# Patient Record
Sex: Male | Born: 1965 | Race: White | Hispanic: No | Marital: Married | Smoking: Never smoker
Health system: Southern US, Community
[De-identification: ages and names within clinical notes are randomized; demographics above are authoritative.]

## PROBLEM LIST (undated history)

## (undated) DIAGNOSIS — I10 Essential (primary) hypertension: Secondary | ICD-10-CM

## (undated) DIAGNOSIS — E785 Hyperlipidemia, unspecified: Secondary | ICD-10-CM

## (undated) DIAGNOSIS — K219 Gastro-esophageal reflux disease without esophagitis: Secondary | ICD-10-CM

## (undated) HISTORY — DX: Hyperlipidemia, unspecified: E78.5

## (undated) HISTORY — DX: Gastro-esophageal reflux disease without esophagitis: K21.9

---

## 1997-11-10 HISTORY — PX: ORCHIECTOMY: SHX2116

## 2004-12-13 ENCOUNTER — Ambulatory Visit: Payer: Self-pay | Admitting: Internal Medicine

## 2005-08-20 ENCOUNTER — Ambulatory Visit: Payer: Self-pay | Admitting: Internal Medicine

## 2005-08-25 ENCOUNTER — Ambulatory Visit: Payer: Self-pay | Admitting: Internal Medicine

## 2006-11-10 HISTORY — PX: UPPER GASTROINTESTINAL ENDOSCOPY: SHX188

## 2006-11-10 HISTORY — PX: VASECTOMY: SHX75

## 2007-11-16 ENCOUNTER — Ambulatory Visit: Payer: Self-pay | Admitting: Internal Medicine

## 2007-11-16 DIAGNOSIS — E785 Hyperlipidemia, unspecified: Secondary | ICD-10-CM

## 2007-11-29 ENCOUNTER — Encounter (INDEPENDENT_AMBULATORY_CARE_PROVIDER_SITE_OTHER): Payer: Self-pay | Admitting: *Deleted

## 2007-12-29 ENCOUNTER — Ambulatory Visit: Payer: Self-pay | Admitting: Gastroenterology

## 2007-12-30 ENCOUNTER — Telehealth (INDEPENDENT_AMBULATORY_CARE_PROVIDER_SITE_OTHER): Payer: Self-pay | Admitting: *Deleted

## 2008-01-04 ENCOUNTER — Ambulatory Visit (HOSPITAL_COMMUNITY): Admission: RE | Admit: 2008-01-04 | Discharge: 2008-01-04 | Payer: Self-pay | Admitting: Gastroenterology

## 2008-01-04 ENCOUNTER — Encounter: Payer: Self-pay | Admitting: Gastroenterology

## 2008-01-04 ENCOUNTER — Encounter: Payer: Self-pay | Admitting: Internal Medicine

## 2008-01-07 ENCOUNTER — Ambulatory Visit: Payer: Self-pay | Admitting: Gastroenterology

## 2008-01-20 DIAGNOSIS — R1310 Dysphagia, unspecified: Secondary | ICD-10-CM | POA: Insufficient documentation

## 2008-03-14 ENCOUNTER — Ambulatory Visit: Payer: Self-pay | Admitting: Internal Medicine

## 2008-03-22 LAB — CONVERTED CEMR LAB
Alkaline Phosphatase: 63 units/L (ref 39–117)
Bilirubin, Direct: 0.1 mg/dL (ref 0.0–0.3)
Total CHOL/HDL Ratio: 3.5
VLDL: 13 mg/dL (ref 0–40)

## 2008-03-23 ENCOUNTER — Encounter (INDEPENDENT_AMBULATORY_CARE_PROVIDER_SITE_OTHER): Payer: Self-pay | Admitting: *Deleted

## 2008-10-17 ENCOUNTER — Telehealth (INDEPENDENT_AMBULATORY_CARE_PROVIDER_SITE_OTHER): Payer: Self-pay | Admitting: *Deleted

## 2009-05-08 ENCOUNTER — Ambulatory Visit: Payer: Self-pay | Admitting: Internal Medicine

## 2009-05-15 ENCOUNTER — Encounter (INDEPENDENT_AMBULATORY_CARE_PROVIDER_SITE_OTHER): Payer: Self-pay | Admitting: *Deleted

## 2009-05-15 LAB — CONVERTED CEMR LAB
HDL: 53 mg/dL (ref >39.00)
LDL Cholesterol: 123 mg/dL — ABNORMAL HIGH (ref 0–99)
Total CHOL/HDL Ratio: 4
Triglycerides: 81 mg/dL (ref 0.0–149.0)

## 2009-05-17 ENCOUNTER — Ambulatory Visit: Payer: Self-pay | Admitting: Internal Medicine

## 2009-05-17 DIAGNOSIS — K219 Gastro-esophageal reflux disease without esophagitis: Secondary | ICD-10-CM | POA: Insufficient documentation

## 2009-05-17 DIAGNOSIS — K222 Esophageal obstruction: Secondary | ICD-10-CM

## 2010-02-22 ENCOUNTER — Telehealth (INDEPENDENT_AMBULATORY_CARE_PROVIDER_SITE_OTHER): Payer: Self-pay | Admitting: *Deleted

## 2010-05-27 ENCOUNTER — Ambulatory Visit: Payer: Self-pay | Admitting: Internal Medicine

## 2010-05-30 ENCOUNTER — Ambulatory Visit: Payer: Self-pay | Admitting: Internal Medicine

## 2010-06-06 LAB — CONVERTED CEMR LAB
ALT: 23 units/L (ref 0–53)
AST: 22 units/L (ref 0–37)
Basophils Relative: 0.4 % (ref 0.0–3.0)
Bilirubin, Direct: 0.1 mg/dL (ref 0.0–0.3)
Chloride: 108 meq/L (ref 96–112)
Creatinine, Ser: 0.7 mg/dL (ref 0.4–1.5)
Eosinophils Relative: 4.9 % (ref 0.0–5.0)
HCT: 44.1 % (ref 39.0–52.0)
LDL Cholesterol: 125 mg/dL — ABNORMAL HIGH (ref 0–99)
MCV: 91.5 fL (ref 78.0–100.0)
Monocytes Absolute: 0.6 10*3/uL (ref 0.1–1.0)
Monocytes Relative: 9.1 % (ref 3.0–12.0)
Neutrophils Relative %: 57.8 % (ref 43.0–77.0)
Potassium: 4.6 meq/L (ref 3.5–5.1)
RBC: 4.82 M/uL (ref 4.22–5.81)
Total CHOL/HDL Ratio: 4
Total Protein: 7.5 g/dL (ref 6.0–8.3)
WBC: 6.7 10*3/uL (ref 4.5–10.5)

## 2010-07-08 ENCOUNTER — Telehealth (INDEPENDENT_AMBULATORY_CARE_PROVIDER_SITE_OTHER): Payer: Self-pay | Admitting: *Deleted

## 2010-10-01 ENCOUNTER — Telehealth (INDEPENDENT_AMBULATORY_CARE_PROVIDER_SITE_OTHER): Payer: Self-pay | Admitting: *Deleted

## 2010-10-30 ENCOUNTER — Telehealth (INDEPENDENT_AMBULATORY_CARE_PROVIDER_SITE_OTHER): Payer: Self-pay | Admitting: *Deleted

## 2010-12-08 LAB — CONVERTED CEMR LAB
AST: 21 units/L (ref 0–37)
Bilirubin, Direct: 0.1 mg/dL (ref 0.0–0.3)
Chloride: 101 meq/L (ref 96–112)
Eosinophils Absolute: 0.2 10*3/uL (ref 0.0–0.6)
Eosinophils Relative: 2.2 % (ref 0.0–5.0)
GFR calc non Af Amer: 98 mL/min
Glucose, Bld: 86 mg/dL (ref 70–99)
HCT: 44.6 % (ref 39.0–52.0)
Hemoglobin: 15.4 g/dL (ref 13.0–17.0)
Lymphocytes Relative: 28.1 % (ref 12.0–46.0)
MCV: 90.2 fL (ref 78.0–100.0)
Monocytes Absolute: 0.4 10*3/uL (ref 0.2–0.7)
Neutrophils Relative %: 63.1 % (ref 43.0–77.0)
RBC: 4.95 M/uL (ref 4.22–5.81)
Sodium: 137 meq/L (ref 135–145)
TSH: 1.8 microintl units/mL (ref 0.35–5.50)
Total Bilirubin: 1 mg/dL (ref 0.3–1.2)
WBC: 6.9 10*3/uL (ref 4.5–10.5)

## 2010-12-10 NOTE — Progress Notes (Signed)
Summary: left msg for pt to call  Phone Note Call from Patient   Caller: Patient Summary of Call: pt called left msg wants referral for dermatology --Called pt left msg to call back more clarification .Kandice Hams  February 22, 2010 2:32 PM Called pt left msg schedule  re request foer dermatology, will need an office visit, please call and schedule.  last ov 7/10 .Kandice Hams  February 27, 2010 1:29 PM   Initial call taken by: Kandice Hams,  February 27, 2010 1:29 PM

## 2010-12-10 NOTE — Progress Notes (Signed)
Summary: Refill Request  Phone Note Refill Request Call back at [phone] Message from:  Pharmacy on October 01, 2010 2:21 PM  Refills Requested: Medication #1:  ZOLPIDEM TARTRATE 5 MG TABS Zolpidem 5 mg.   Dosage confirmed as above?Dosage Confirmed   Supply Requested: 1 month   Last Refilled: 07/08/2010 [facility] [address]   Next Appointment Scheduled: none Initial call taken by: Harold Barban,  October 01, 2010 2:22 PM    Prescriptions: ZOLPIDEM TARTRATE 5 MG TABS (ZOLPIDEM TARTRATE) Zolpidem 5 mg  #10 x 0   Entered by:   Shonna Chock CMA   Authorized by:   Marga Melnick MD   Signed by:   Shonna Chock CMA on 10/01/2010   Method used:   Printed then faxed to ...       [facility] (retail)       [address]       [place], [place]  [address]       Ph: [phone]       Fax: [phone]   RxID:   [id number]

## 2010-12-10 NOTE — Assessment & Plan Note (Signed)
Summary: med refill/kdc   Vital Signs:  Patient profile:   45 year old male Height:      73.75 inches Weight:      182.6 pounds BMI:     23.69 Temp:     98.2 degrees F oral Pulse rate:   72 / minute Resp:     14 per minute BP sitting:   118 / 76  (left arm) Cuff size:   large  Vitals Entered By: Shonna Chock CMA (May 27, 2010 2:21 PM)    History of Present Illness: [name] [name] is here for a physical; he is asymptomatic.His only Rx med is Pravastatin.  The patient denies muscle aches, GI upset, abdominal pain, flushing, itching, constipation, diarrhea, and fatigue.  The patient denies the following symptoms: chest pain/pressure, exercise intolerance, dypsnea, palpitations, syncope, and pedal edema.  Compliance with medications (by patient report) has been near 100%.  Dietary compliance has been excellent.  Adjunctive measures currently used by the patient include ASA, fish oil supplements, and Co-QA.    Lipid Management History:      Negative NCEP/ATP III risk factors include male age less than 4 years old, non-diabetic, no family history for ischemic heart disease, non-tobacco-user status, non-hypertensive, no ASHD (atherosclerotic heart disease), no prior stroke/TIA, no peripheral vascular disease, and no history of aortic aneurysm.     Current Medications (verified): 1)  Pravastatin Sodium 40 Mg Tabs (Pravastatin Sodium) .Marland Kitchen.. 1 At Bedtime 2)  Coq10 100 Mg Caps (Coenzyme Q10) .Marland Kitchen.. 1 By Mouth Once Daily 3)  Aspirin 81 Mg Tabs (Aspirin) .Marland Kitchen.. 1 By Mouth Once Daily 4)  Multivitamins  Tabs (Multiple Vitamin) .Marland Kitchen.. 1 By Mouth Once Daily 5)  Fish Oil 2000mg  .... 1 By Mouth Once Daily 6)  Zinc 100 Mg Tabs (Zinc) .Marland Kitchen.. 1 By Mouth Once Daily 7)  Melatonin 1 Mg Caps (Melatonin) .Marland Kitchen.. 1 By Mouth Once Daily  Allergies (verified): No Known Drug Allergies  Past History:  Past Medical History: Seborrheic Dermatitis Hyperlipidemia: 2009NMR Lipoprofile: LDL 194( 1891/908), HDL 49,TG 103. LDL goal =  < 130.[place] Study LDL goal = < 160. GERD with esophageal stricture  Past Surgical History: 1998 nodule  right testicle  , benign  Endoscopy : esophageal stricture, S/P dilatation 2009, Dr Barnet Pall  Family History: mother: elevated cholesterol father :stent @ 27 sister :good health brother: good health; paternal  grandparents : CVA & ? MI; MGF :CHF; MGM :CHF  Social History: heart healthy Occupation: Pensions consultant Married Never Smoked Alcohol use-yes: socially Regular exercise-yes:CVE 60 min 3X/week  Review of Systems  The patient denies anorexia, fever, weight loss, weight gain, vision loss, decreased hearing, hoarseness, prolonged cough, headaches, abdominal pain, melena, hematochezia, severe indigestion/heartburn, hematuria, suspicious skin lesions, depression, unusual weight change, abnormal bleeding, enlarged lymph nodes, and angioedema.    Physical Exam  General:  well-nourished; alert,appropriate and cooperative throughout examination Head:  Normocephalic and atraumatic without obvious abnormalities. Pattern  alopecia  Eyes:  No corneal or conjunctival inflammation noted. Perrla. Funduscopic exam benign, without hemorrhages, exudates or papilledema. Ears:  External ear exam shows no significant lesions or deformities.  Otoscopic examination reveals clear canals, tympanic membranes are intact bilaterally without bulging, retraction, inflammation or discharge. Hearing is grossly normal bilaterally. Nose:  External nasal examination shows no deformity or inflammation. Nasal mucosa are pink and moist without lesions or exudates. Mouth:  Oral mucosa and oropharynx without lesions or exudates.  Teeth in good repair. Neck:  No deformities, masses, or tenderness noted. Lungs:  Normal  respiratory effort, chest expands symmetrically. Lungs are clear to auscultation, no crackles or wheezes. Heart:  Normal rate and regular rhythm. S1 and S2 normal without gallop, murmur, click, rub .S4  with slurring Abdomen:  Bowel sounds positive,abdomen soft and non-tender without masses, organomegaly or hernias noted. Rectal:  No external abnormalities noted. Normal sphincter tone. No rectal masses or tenderness. Genitalia:   Single testicle  without nodularity, tenderness or masses. No scrotal masses or lesions. No penis lesions or urethral discharge. Prostate:  Prostate gland firm and smooth, no enlargement, nodularity, tenderness, mass, asymmetry or induration. Msk:  No deformity or scoliosis noted of thoracic or lumbar spine.   Pulses:  R and L carotid,radial,dorsalis pedis and posterior tibial pulses are full and equal bilaterally Extremities:  No clubbing, cyanosis, edema, or deformity noted with normal full range of motion of all joints.   Neurologic:  alert & oriented X3, gait normal, and DTRs symmetrical and normal.   Skin:  Intact without suspicious lesions or rashes Cervical Nodes:  No lymphadenopathy noted Axillary Nodes:  No palpable lymphadenopathy Inguinal Nodes:  No significant adenopathy Psych:  memory intact for recent and remote, normally interactive, and good eye contact.     Impression & Recommendations:  Problem # 1:  ROUTINE GENERAL MEDICAL EXAM@HEALTH  CARE FACL (ICD-V70.0)  Orders: EKG w/ Interpretation (93000)  Problem # 2:  HYPERLIPIDEMIA (ICD-272.2)  The following medications were removed from the medication list:    Pravachol 40 Mg Tabs (Pravastatin sodium) .Marland Kitchen... 1 at bedtime**labs due now** His updated medication list for this problem includes:    Pravastatin Sodium 40 Mg Tabs (Pravastatin sodium) .Marland Kitchen... 1 at bedtime  Problem # 3:  GERD (ICD-530.81) controlled The following medications were removed from the medication list:    Pantoprazole Sodium 40 Mg Tbec (Pantoprazole sodium) .Marland Kitchen... 1 by mouth once daily**office visit due now**    Omeprazole 20 Mg Cpdr (Omeprazole) .Marland Kitchen... 1 q am  Complete Medication List: 1)  Pravastatin Sodium 40 Mg Tabs  (Pravastatin sodium) .Marland Kitchen.. 1 at bedtime 2)  Coq10 100 Mg Caps (Coenzyme q10) .Marland Kitchen.. 1 by mouth once daily 3)  Aspirin 81 Mg Tabs (Aspirin) .Marland Kitchen.. 1 by mouth once daily 4)  Multivitamins Tabs (Multiple vitamin) .Marland Kitchen.. 1 by mouth once daily 5)  Fish Oil 2000mg   .... 1 by mouth once daily 6)  Zinc 100 Mg Tabs (Zinc) .Marland Kitchen.. 1 by mouth once daily 7)  Melatonin 1 Mg Caps (Melatonin) .Marland Kitchen.. 1 by mouth once daily  Lipid Assessment/Plan:      Based on NCEP/ATP III, the patient's risk factor category is "0-1 risk factors".  The patient's lipid goals are as follows: Total cholesterol goal is 200; LDL cholesterol goal is 130; HDL cholesterol goal is 40; Triglyceride goal is 150.  His LDL cholesterol goal has been met.    Patient Instructions: 1)  Please schedule fasting labs. 2)  Avoid foods high in acid (tomatoes, citrus juices, spicy foods). Avoid eating within two hours of lying down or before exercising. Do not over eat; try smaller more frequent meals. Elevate head of bed twelve inches when sleeping. 3)  BMP ; 4)  Hepatic Panel ; 5)  Lipid Panel; 6)  TSH ; 7)  CBC w/ Diff . Prescriptions: PRAVASTATIN SODIUM 40 MG TABS (PRAVASTATIN SODIUM) 1 at bedtime  #90 x 3   Entered and Authorized by:   Marga Melnick MD   Signed by:   Marga Melnick MD on 05/27/2010   Method used:   Print  then Give to Patient   RxID:   [id number]

## 2010-12-10 NOTE — Progress Notes (Signed)
Summary: RX FOR AMBIEN  Phone Note Call from Patient   Caller: Patient Reason for Call: Privacy/Consent Authorization Summary of Call: PT WOULD LIKE AN RX FOR AMBIEN. RX IS NOT ON MED LIST. PLEASE ADVISE. [facility] [address] [phone]. Initial call taken by: Lavell Islam,  July 08, 2010 9:20 AM  Follow-up for Phone Call        I left message on VM informing patient Dr.Hopper is out of the office(will return tomorrow) and he will have to advise on adding controlled med back to patient's med list Follow-up by: Shonna Chock CMA,  July 08, 2010 12:30 PM  Additional Follow-up for Phone Call Additional follow up Details #1::        Zolpidem 5 mg at bedtime every 3rd night as needed  #10. If insomnia is chronic ; I recommend consulatation with Dr Glean Salen , Sleep Specialist to determine cause& most effective intervention.    New/Updated Medications: ZOLPIDEM TARTRATE 5 MG TABS (ZOLPIDEM TARTRATE) Zolpidem 5 mg Prescriptions: ZOLPIDEM TARTRATE 5 MG TABS (ZOLPIDEM TARTRATE) Zolpidem 5 mg  #10 x 0   Entered by:   Shonna Chock CMA   Authorized by:   Loreen Freud DO   Signed by:   Shonna Chock CMA on 07/08/2010   Method used:   Printed then faxed to ...       [facility] (retail)       [address]       [place], [place]  [address]       Ph: [phone]       Fax: [phone]   RxID:   [id number]

## 2010-12-12 NOTE — Progress Notes (Signed)
Summary: Refill Request  Phone Note Refill Request Call back at [phone] Message from:  Pharmacy on October 30, 2010 8:13 AM  Refills Requested: Medication #1:  ZOLPIDEM TARTRATE 5 MG TABS Zolpidem 5 mg.   Dosage confirmed as above?Dosage Confirmed   Supply Requested: 10   Last Refilled: [facility]/01/2010 [facility] [address]   Next Appointment Scheduled: none Initial call taken by: Harold Barban,  October 30, 2010 8:14 AM    Prescriptions: ZOLPIDEM TARTRATE 5 MG TABS (ZOLPIDEM TARTRATE) Zolpidem 5 mg  #10 x 0   Entered by:   Shonna Chock CMA   Authorized by:   Marga Melnick MD   Signed by:   Shonna Chock CMA on 10/30/2010   Method used:   Telephoned to ...       [facility] (retail)       [address]       [place], [place]  [address]       Ph: [phone]       Fax: [phone]   RxID:   [id number]

## 2011-01-01 ENCOUNTER — Telehealth (INDEPENDENT_AMBULATORY_CARE_PROVIDER_SITE_OTHER): Payer: Self-pay | Admitting: *Deleted

## 2011-01-07 NOTE — Progress Notes (Signed)
Summary: refill  Phone Note Refill Request Message from:  Fax from Pharmacy on January 01, 2011 9:19 AM  Refills Requested: Medication #1:  ZOLPIDEM TARTRATE 5 MG TABS Zolpidem [facility] mg. [facility] [address] - fax [phone]  Initial call taken by: Okey Regal Spring,  January 01, 2011 9:20 AM    Prescriptions: ZOLPIDEM TARTRATE 5 MG TABS (ZOLPIDEM TARTRATE) Zolpidem 5 mg  #10 x 1   Entered by:   Shonna Chock CMA   Authorized by:   Marga Melnick MD   Signed by:   Shonna Chock CMA on 01/01/2011   Method used:   Printed then faxed to ...       [facility] (retail)       [address]       [place], [place]  [address]       Ph: [phone]       Fax: [phone]   RxID:   [id number]

## 2011-01-27 ENCOUNTER — Telehealth: Payer: Self-pay

## 2011-01-30 ENCOUNTER — Other Ambulatory Visit: Payer: Self-pay | Admitting: Internal Medicine

## 2011-01-30 MED ORDER — ZOLPIDEM TARTRATE 5 MG PO TABS: 5.0000 mg | ORAL_TABLET | Freq: Every evening | ORAL | Status: DC | PRN

## 2011-01-30 NOTE — Telephone Encounter (Signed)
This was filled on 01/01/11 #10 with 1 refill. I spoke with patient  On 01/27/2011 and he indicated that he got the #10 but not the refill. I then called the pharmacy and had them ok the 1 refill pending.   I called pharmacy today to see if patient was requesting med again today and spoke with Tresa Endo and she indicated no this was on automatic refill and we can void

## 2011-02-06 NOTE — Progress Notes (Signed)
Summary: Refill   Phone Note Refill Request Message from:  Fax from Pharmacy on January 27, 2011 10:05 AM  Refills Requested: Medication #1:  ZOLPIDEM TARTRATE 5 MG TABS Zolpidem [facility] mg. [facility] - fax [phone]  Initial call taken by: Okey Regal Spring,  January 27, 2011 10:05 AM  Follow-up for Phone Call        Left message on machine for patient to return call when avaliable, Reason for call:   Discuss refill request, on 01/01/2011 # 10 with 1 refill given. RX to be taken 1 by mouth every 3rd day as needed. ? if patient already took #10 and refill that was given. Follow-up by: Shonna Chock CMA,  January 27, 2011 10:28 AM  Additional Follow-up for Phone Call Additional follow up Details #1::        Spoke with patient, patient states he picked up # 10 but not the refill yet, I informed patient I will make sure refill on file at pharmacy. Additional Follow-up by: Shonna Chock CMA,  January 27, 2011 11:17 AM

## 2011-02-12 ENCOUNTER — Other Ambulatory Visit: Payer: Self-pay

## 2011-02-12 MED ORDER — ZOLPIDEM TARTRATE 5 MG PO TABS: 5.0000 mg | ORAL_TABLET | Freq: Every evening | ORAL | Status: DC | PRN

## 2011-03-25 NOTE — Assessment & Plan Note (Signed)
[facility]                         GASTROENTEROLOGY OFFICE NOTE   [name] [name], [name] [name]                        MRN:          [id number]  DATE:12/29/2007                            DOB:          Dec 22, 1965    REASON FOR CONSULTATION:  Dysphagia.   Mr. Holycross is a pleasant 45 year old white male referred through the  courtesy of Dr. Alwyn Ren for evaluation.  Over the last 2 years, he has  been suffering from intermittent dysphagia to solids.  He has had  several food impactions that were relieved with vomiting.  He denies  pyrosis.  He also denies dysphagia to liquids.  He is without chest  pain.   PAST MEDICAL HISTORY:  Hypercholesterolemia.   FAMILY HISTORY:  Noncontributory.   He is on no medications.  There is no allergy.  He does not smoke.  Drinks rarely.  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   REVIEW OF SYSTEMS:  Negative.   EXAM:  Pulse 88, blood pressure 118/76, weight 187.  HEENT:  EOMI. PERRLA. Sclerae are anicteric.  Conjunctivae are pink.  NECK;  Supple without thyromegaly, adenopathy or carotid bruits.  CHEST:  Clear to auscultation and percussion without adventitious  sounds.  CARDIAC:  Regular rhythm; normal S1 S2.  There are no murmurs, gallops  or rubs.  ABDOMEN:  Bowel sounds are normoactive.  Abdomen is soft, non-tender and  non-distended.  There are no abdominal masses, tenderness, splenic  enlargement or hepatomegaly.  EXTREMITIES:  Full range of motion.  No cyanosis, clubbing or edema.  RECTAL:  Deferred.   IMPRESSION:  1. Dysphagia, likely due to peptic stricture.  Motility disorder is      unlikely in the absence of dysphagia to liquids.  2. Hypercholesterolemia.   RECOMMENDATION:  Upper endoscopy with dilatation as indicated.     Barbette Hair. Arlyce Dice, MD,FACG  Electronically Signed    RDK/MedQ  DD: 12/29/2007  DT: 12/30/2007  Job #: [id number]   cc:   Titus Dubin. Alwyn Ren, MD,FACP,FCCP

## 2011-03-28 NOTE — Assessment & Plan Note (Signed)
[facility]                                 ON-CALL NOTE   LENORRIS, KARGER                          MRN:          [id number]  DATE:12/02/2008                            DOB:          10-12-1966    TIME RECEIVED:  5:32 p.m.   The caller is Dellia Cloud.   TELEPHONE:  [id number].   He sees Dr. Alwyn Ren.  The patient had onset today of vomiting and has had  nausea and vomiting with mild stomach cramps throughout the day.  There  is no fever, no diarrhea.  He is keeping fluids down well orally.  Several of his children had several days of vomiting earlier in the week  and then recovered.  I did call in Phenergan 25 mg tablets to use every  4 hours as needed #30 with no refills to write aid at [phone].  I told  him this is consistent with a viral gastroenteritis.  He can follow up  as needed.     Tera Mater. Clent Ridges, MD  Electronically Signed    SAF/MedQ  DD: 12/02/2008  DT: 12/02/2008  Job #: [id number]

## 2011-04-10 ENCOUNTER — Other Ambulatory Visit: Payer: Self-pay | Admitting: Internal Medicine

## 2011-04-10 MED ORDER — ZOLPIDEM TARTRATE 5 MG PO TABS: 5.0000 mg | ORAL_TABLET | Freq: Every evening | ORAL | Status: DC | PRN

## 2011-04-10 NOTE — Telephone Encounter (Signed)
rx sent to pharmacy, patient with pending appointment in 05/2011

## 2011-05-24 ENCOUNTER — Encounter: Payer: Self-pay | Admitting: Internal Medicine

## 2011-05-26 ENCOUNTER — Other Ambulatory Visit: Payer: Self-pay | Admitting: Internal Medicine

## 2011-05-29 ENCOUNTER — Encounter: Payer: Self-pay | Admitting: Internal Medicine

## 2011-05-29 ENCOUNTER — Ambulatory Visit (INDEPENDENT_AMBULATORY_CARE_PROVIDER_SITE_OTHER): Payer: PRIVATE HEALTH INSURANCE | Admitting: Internal Medicine

## 2011-05-29 VITALS — BP 124/82 | HR 72 | Temp 97.6°F | Resp 12 | Ht 73.75 in | Wt 179.6 lb

## 2011-05-29 DIAGNOSIS — K222 Esophageal obstruction: Secondary | ICD-10-CM

## 2011-05-29 DIAGNOSIS — Z0000 Encounter for general adult medical examination without abnormal findings: Secondary | ICD-10-CM

## 2011-05-29 DIAGNOSIS — R1310 Dysphagia, unspecified: Secondary | ICD-10-CM

## 2011-05-29 DIAGNOSIS — K219 Gastro-esophageal reflux disease without esophagitis: Secondary | ICD-10-CM

## 2011-05-29 DIAGNOSIS — E782 Mixed hyperlipidemia: Secondary | ICD-10-CM

## 2011-05-29 LAB — CBC WITH DIFFERENTIAL/PLATELET
Basophils Absolute: 0 10*3/uL (ref 0.0–0.1)
Eosinophils Relative: 2.8 % (ref 0.0–5.0)
HCT: 44.2 % (ref 39.0–52.0)
Hemoglobin: 15.1 g/dL (ref 13.0–17.0)
Lymphs Abs: 1.7 10*3/uL (ref 0.7–4.0)
MCV: 91.9 fl (ref 78.0–100.0)
Monocytes Absolute: 0.5 10*3/uL (ref 0.1–1.0)
Monocytes Relative: 7.4 % (ref 3.0–12.0)
Neutro Abs: 4.2 10*3/uL (ref 1.4–7.7)
Platelets: 268 10*3/uL (ref 150.0–400.0)
RDW: 13.6 % (ref 11.5–14.6)

## 2011-05-29 LAB — HEPATIC FUNCTION PANEL
ALT: 26 U/L (ref 0–53)
Albumin: 4.4 g/dL (ref 3.5–5.2)
Total Protein: 8.2 g/dL (ref 6.0–8.3)

## 2011-05-29 LAB — BASIC METABOLIC PANEL
BUN: 19 mg/dL (ref 6–23)
Chloride: 103 mEq/L (ref 96–112)
GFR: 124.93 mL/min (ref >60.00)
Glucose, Bld: 92 mg/dL (ref 70–99)
Potassium: 3.8 mEq/L (ref 3.5–5.1)
Sodium: 140 mEq/L (ref 135–145)

## 2011-05-29 LAB — LIPID PANEL
LDL Cholesterol: 99 mg/dL (ref 0–99)
VLDL: 20 mg/dL (ref 0.0–40.0)

## 2011-05-29 LAB — TSH: TSH: 1.14 u[IU]/mL (ref 0.35–5.50)

## 2011-05-29 MED ORDER — ZOLPIDEM TARTRATE 5 MG PO TABS: 5.0000 mg | ORAL_TABLET | ORAL | Status: DC

## 2011-05-29 MED ORDER — SERTRALINE HCL 25 MG PO TABS: 25.0000 mg | ORAL_TABLET | Freq: Every day | ORAL | Status: DC

## 2011-05-29 NOTE — Assessment & Plan Note (Signed)
Controlled with as needed Zantac 75

## 2011-05-29 NOTE — Patient Instructions (Signed)
Preventive Health Care: Exercise at least 30-45 minutes a day,  3-4 days a week.  Eat a low-fat diet with lots of fruits and vegetables, up to 7-9 servings per day. Consume less than 40 grams of sugar per day from foods & drinks with High Fructose Corn Sugar as #1,2,3 or # 4 on label. Sleep hygiene measures as discussed. If the sleeping issues get worse; I do recommend consultation with a sleep specialist (Dr. Marylou Flesher)

## 2011-05-29 NOTE — Progress Notes (Signed)
Subjective:    Patient ID: TORRELL KRUTZ, male    DOB: May 08, 1966, 45 y.o.   MRN: [id number]  HPI  Mr Mcelwain  is here for a physical; he has no acute issues  But he recently ruptured his R TM diving.      Review of Systems Patient reports no  vision/ hearing changes,anorexia, weight change, fever ,adenopathy, persistant / recurrent hoarseness, swallowing issues ( rarely with chicken), chest pain,palpitations, edema,persistant / recurrent cough, hemoptysis, dyspnea(rest, exertional, paroxysmal nocturnal), gastrointestinal  bleeding (melena, rectal bleeding), abdominal pain, excessive heart burn, GU symptoms( dysuria, hematuria, pyuria, voiding/incontinence  issues) syncope, focal weakness, memory loss,numbness & tingling, skin/hair/nail changes,depression, anxiety, abnormal bruising/bleeding, or  musculoskeletal symptoms/signs.   He has some chronic sleep issues. He will watch TV in bed on occasion. He has difficulty going to sleep. He denies restless leg syndrome or apnea. He's been taking Ambien 1/3  pill each night.    Objective:   Physical Exam Gen.: Thin but healthy and well-nourished in appearance. Alert, appropriate and cooperative throughout exam. Head: Normocephalic without obvious abnormalities  Eyes: No corneal or conjunctival inflammation noted. Pupils equal round reactive to light and accommodation. Fundal exam is benign without hemorrhages, exudate, papilledema. Extraocular motion intact. Vision grossly normal. Ears: External  ear exam reveals no significant lesions or deformities. Canals clear. R TMs  Brawny & dull red. Hearing is grossly normal bilaterally. Nose: External nasal exam reveals no deformity or inflammation. Nasal mucosa are pink and moist. No lesions or exudates noted. Septum  : minor dislocation to R  Mouth: Oral mucosa and oropharynx reveal no lesions or exudates. Teeth in good repair. Neck: No deformities, masses, or tenderness noted. Range of motion &. Thyroid  normal. Lungs: Normal respiratory effort; chest expands symmetrically. Lungs are clear to auscultation without rales, wheezes, or increased work of breathing. Heart: Slow rate ; regular rhythm. Normal S1 and S2. No gallop, click, or rub. No murmur. Abdomen: Bowel sounds normal; abdomen soft and nontender. No masses, organomegaly or hernias noted. Genitalia/DRE: single testicle; prostate normal.   .                                                                                   Musculoskeletal/extremities: No deformity or scoliosis noted of  the thoracic or lumbar spine. No clubbing, cyanosis, edema, or deformity noted. Range of motion  normal .Tone & strength  normal.Joints normal. Nail health  good. Vascular: Carotid, radial artery, dorsalis pedis and  posterior tibial pulses are full and equal. No bruits present. Neurologic: Alert and oriented x3. Deep tendon reflexes symmetrical and normal.          Skin: Intact without suspicious lesions or rashes. Lymph: No cervical, axillary, or inguinal lymphadenopathy present. Psych: Mood and affect are normal. Normally interactive  Assessment & Plan:  #1 comprehensive physical exam; no acute findings #2 see Problem List with Assessments & Recommendations Plan: see Orders

## 2011-06-02 ENCOUNTER — Other Ambulatory Visit: Payer: Self-pay | Admitting: Internal Medicine

## 2011-06-25 ENCOUNTER — Telehealth: Payer: Self-pay | Admitting: Internal Medicine

## 2011-06-25 NOTE — Telephone Encounter (Signed)
Discontinue sertraline. Start Bupropion SR 150 mg qd #30

## 2011-06-25 NOTE — Telephone Encounter (Signed)
Left message to call office

## 2011-06-25 NOTE — Telephone Encounter (Signed)
Pt wife called at husband's request would like to have zoloft switched to wellbutrin due to sexual side effects. Pls advise.

## 2011-06-26 MED ORDER — BUPROPION HCL ER (SR) 150 MG PO TB12: 150.0000 mg | ORAL_TABLET | Freq: Every day | ORAL | Status: DC

## 2011-06-26 NOTE — Telephone Encounter (Signed)
Spoke w/ aware of change in medication.

## 2011-07-28 ENCOUNTER — Other Ambulatory Visit: Payer: Self-pay | Admitting: Internal Medicine

## 2011-07-31 ENCOUNTER — Other Ambulatory Visit: Payer: Self-pay | Admitting: Internal Medicine

## 2011-07-31 MED ORDER — ZOLPIDEM TARTRATE 5 MG PO TABS: 5.0000 mg | ORAL_TABLET | ORAL | Status: DC

## 2011-11-25 ENCOUNTER — Other Ambulatory Visit: Payer: Self-pay | Admitting: Internal Medicine

## 2011-11-25 NOTE — Telephone Encounter (Signed)
Spoke with patient, patient states he is out of med was taking every 3rd night and possible took every other night at times. Dr.Hopper please advise if ok to give patient another 30day supply or if medication can dose can be increased

## 2011-11-25 NOTE — Telephone Encounter (Signed)
Left message on voicemail for patient to return call, reason for call:  Med was filled on 07/31/2011, #30/1 refill (6 month supply)-to be taken every 3rd night (10 pills per month). Refill not due until March 2013 (based on our records)

## 2011-11-25 NOTE — Telephone Encounter (Signed)
If this medicine needs to be taken more often every third night; I recommend a sleep disorder evaluation with Dr. Marylou Flesher, a sleep specialist . OK to refill x1. If insomnia is a chronic problem; Sleep specialty evaluation is indicated. There are many different causes of disturbed sleep including restless leg syndrome , sleep apnea, et Karie Soda. Chronic use of sleeping pills has a number of adverse effects. These include tolerance or lack of effectiveness; automated  behavior such as driving or eating while actually asleep ; & also potential  for habituation.The exact etiology of insomnia  should be be diagnosed for optimal response and avoidance of these potential adverse effects.

## 2011-11-25 NOTE — Telephone Encounter (Signed)
Patient returned phone call. Best # [phone]

## 2011-11-26 MED ORDER — ZOLPIDEM TARTRATE 5 MG PO TABS: 5.0000 mg | ORAL_TABLET | ORAL | Status: DC

## 2011-11-26 NOTE — Telephone Encounter (Signed)
Patient aware rx sent in, patient also informed of Dr.Hopper's instruction

## 2011-11-28 ENCOUNTER — Other Ambulatory Visit: Payer: Self-pay

## 2011-11-28 MED ORDER — PRAVASTATIN SODIUM 40 MG PO TABS: ORAL_TABLET | ORAL | Status: DC

## 2011-11-28 NOTE — Telephone Encounter (Signed)
Patient aware rx sent to pharmacy.  

## 2011-12-18 ENCOUNTER — Telehealth: Payer: Self-pay

## 2011-12-18 NOTE — Telephone Encounter (Signed)
Patient at the pharmacy picking up rx for ambien-? If ok to fill, based on instruction patient filling too early. Per note on 11/25/2011, Dr.Hopper aware patient stated he may having taking every other night vs every 3rd night. Patient to get this rx and take as directed on instruction otherwise patient to be sent to sleep specialist. Pharmacy stated they will note this information and fill med.    I left message on voicemail for patient to return call when available. Reason for call (not left on patient's VM) was to reiterate Dr.Hopper's response to overuse of sleep aid's

## 2011-12-19 ENCOUNTER — Telehealth: Payer: Self-pay | Admitting: Internal Medicine

## 2011-12-19 NOTE — Telephone Encounter (Signed)
Patient called back and ok'd instruction about taking med every 3rd night ONLY, patient aware if he needs med nor frequently we will refer to sleep specialist or he can come in to f/u with Dr.Hopper

## 2011-12-19 NOTE — Telephone Encounter (Signed)
[facility] Triage Call Report Triage Record Num: [id number] Operator: Baldomero Lamy Patient Name: [name] [name] Call Date & Time: 12/18/2011 5:43:26PM Patient Phone: [phone] PCP: Marga Melnick Patient Gender: Male PCP Fax : [phone] Patient DOB: 07-23-1966 Practice Name: [facility] Reason for Call: Follow up to previous med question. Pt has called again to try to get Ambien filled. Informed Brysa, Rph and pt that Chrai, Dr. Caryl Never RN would be calling [facility] per note at 1740 (now 1750). Protocol(s) Used: Information Only Call; No Symptom Triage (Adult) Recommended Outcome per Protocol: Provide Information or Advice Only Reason for Outcome: Follow-up call to recent contact; no triage required. Information provided from past call documentation, approved references or experience. Care Advice:

## 2011-12-19 NOTE — Telephone Encounter (Signed)
If insomnia is a chronic problem & sleeping pills are needed > every 3rd nigh;tSleep specialty evaluation is indicated. There are many different causes of disturbed sleep including restless leg syndrome , sleep apnea, et Karie Soda. Chronic use of sleeping pills has a number of adverse effects. These include tolerance or lack of effectiveness; automated  behavior such as driving or eating while actually asleep ; & also potential  for habituation.The exact etiology of insomnia  should be be diagnosed for optimal response and avoidance of these potential adverse effects.  [employer]

## 2011-12-19 NOTE — Telephone Encounter (Signed)
See phone note dated 12/18/2011

## 2012-02-04 ENCOUNTER — Encounter: Payer: Self-pay | Admitting: Internal Medicine

## 2012-02-04 ENCOUNTER — Ambulatory Visit (INDEPENDENT_AMBULATORY_CARE_PROVIDER_SITE_OTHER): Payer: PRIVATE HEALTH INSURANCE | Admitting: Internal Medicine

## 2012-02-04 DIAGNOSIS — Z9079 Acquired absence of other genital organ(s): Secondary | ICD-10-CM

## 2012-02-04 DIAGNOSIS — G478 Other sleep disorders: Secondary | ICD-10-CM

## 2012-02-04 DIAGNOSIS — G479 Sleep disorder, unspecified: Secondary | ICD-10-CM | POA: Insufficient documentation

## 2012-02-04 DIAGNOSIS — N410 Acute prostatitis: Secondary | ICD-10-CM

## 2012-02-04 DIAGNOSIS — R319 Hematuria, unspecified: Secondary | ICD-10-CM

## 2012-02-04 LAB — POCT URINALYSIS DIPSTICK
Leukocytes, UA: NEGATIVE
Nitrite, UA: NEGATIVE
Protein, UA: NEGATIVE
Spec Grav, UA: 1.02
Urobilinogen, UA: 0.2

## 2012-02-04 MED ORDER — CLONAZEPAM 0.5 MG PO TABS: 0.5000 mg | ORAL_TABLET | Freq: Every evening | ORAL | Status: DC | PRN

## 2012-02-04 MED ORDER — CIPROFLOXACIN HCL 500 MG PO TABS: 500.0000 mg | ORAL_TABLET | Freq: Two times a day (BID) | ORAL | Status: AC

## 2012-02-04 NOTE — Patient Instructions (Signed)
To prevent sleep dysfunction follow these instructions for sleep hygiene. Do not read, watch TV, or eat in bed. Do not get into bed until you are ready to turn off the light &  to go to sleep. Do not ingest stimulants ( decongestants, diet pills, nicotine, caffeine) after the evening meal.  Drink as much nondairy fluids as possible. Avoid spicy foods or alcohol as  these may aggravate the prostate. Do not take decongestants. Avoid narcotics if possible.

## 2012-02-04 NOTE — Progress Notes (Signed)
  Subjective:    Patient ID: [name] [name], male    DOB: 06/06/1966, 46 y.o.   MRN: [id number]  HPI HEMATURIA: Onset: this am  Character: some blood in urine which tapered off & resolved Symptoms Urgency: no Frequency: no Hesitancy: no  Hematospermia:no Flank Pain: no Fever: no Nausea/Vomiting:no  Discharge:no Rash: no   PMH of  1. DM: no 2. Renal Disease/Calculi: no 3. Urinary Tract Abnormality: no 4. Instrumentation/Trauma: no  There is no family history of genitourinary disease   Review of Systems he denies any bleeding dyscrasias. Specifically he has not had epistaxis, hemoptysis, rectal bleeding, melena or abnormal bruising or bleeding His chronic sleep issues were also reviewed; this is manifested as a difficulty going to sleep. Its as if " he can't shut his brain off"     Objective:   Physical Exam General appearance is one of good health and nourishment w/o distress.  Eyes: No conjunctival inflammation or scleral icterus is present.    Heart:  Normal rate and regular rhythm. S1 and S2 normal without gallop,  click, rub or other extra sounds  .Grade 1/6 systolic murmur    Lungs:Chest clear to auscultation; no wheezes, rhonchi,rales ,or rubs present.No increased work of breathing.   Abdomen: bowel sounds normal, soft and non-tender without masses, organomegaly or hernias noted.  No guarding or rebound .  No flank tenderness to percussion  Skin:Warm & dry.  Intact without suspicious lesions or rashes   Lymphatic: No lymphadenopathy is noted about the head, neck, axilla, or inguinal areas.   Right testicle absent surgically;  otherwise unremarkable.  Prostate is normal size without asymmetry, induration, or nodularity. However, it was tender to palpation             Assessment & Plan:  #1 hematuria, painless  #2 possible mild prostatitis  Plan: See orders and recommendations

## 2012-05-03 ENCOUNTER — Telehealth: Payer: Self-pay | Admitting: Internal Medicine

## 2012-05-03 MED ORDER — CLONAZEPAM 0.5 MG PO TABS: 0.5000 mg | ORAL_TABLET | Freq: Every evening | ORAL | Status: DC | PRN

## 2012-05-03 NOTE — Telephone Encounter (Signed)
RX sent

## 2012-05-03 NOTE — Telephone Encounter (Signed)
Refill: Clonazepam 0.5mg  tablet. Take 1 tablet by mouth at bedtime as needed for anxiety. Qty 30. Last fill 03-30-12

## 2012-05-14 ENCOUNTER — Other Ambulatory Visit: Payer: Self-pay | Admitting: Internal Medicine

## 2012-05-14 DIAGNOSIS — G479 Sleep disorder, unspecified: Secondary | ICD-10-CM

## 2012-05-14 MED ORDER — CLONAZEPAM 0.5 MG PO TABS: 0.5000 mg | ORAL_TABLET | Freq: Every evening | ORAL | Status: DC | PRN

## 2012-05-14 NOTE — Telephone Encounter (Signed)
refill clonazepam 0.5mg t tablet  #30, take one tablet by mouth at bedtime as needed for anxiety, last fill 5.21.13, last ov 3.27.13

## 2012-05-14 NOTE — Telephone Encounter (Signed)
OK X1 

## 2012-05-14 NOTE — Telephone Encounter (Signed)
Rx sent 

## 2012-05-14 NOTE — Telephone Encounter (Signed)
Ok to refill 

## 2012-06-01 ENCOUNTER — Ambulatory Visit (INDEPENDENT_AMBULATORY_CARE_PROVIDER_SITE_OTHER): Payer: PRIVATE HEALTH INSURANCE | Admitting: Internal Medicine

## 2012-06-01 ENCOUNTER — Encounter: Payer: Self-pay | Admitting: Internal Medicine

## 2012-06-01 VITALS — BP 118/80 | HR 77 | Temp 98.3°F | Resp 12 | Ht 73.08 in | Wt 179.4 lb

## 2012-06-01 DIAGNOSIS — G479 Sleep disorder, unspecified: Secondary | ICD-10-CM

## 2012-06-01 DIAGNOSIS — Z0000 Encounter for general adult medical examination without abnormal findings: Secondary | ICD-10-CM

## 2012-06-01 LAB — CBC WITH DIFFERENTIAL/PLATELET
Basophils Absolute: 0 10*3/uL (ref 0.0–0.1)
Eosinophils Absolute: 0.2 10*3/uL (ref 0.0–0.7)
HCT: 43.5 % (ref 39.0–52.0)
Lymphs Abs: 1.7 10*3/uL (ref 0.7–4.0)
MCHC: 33.5 g/dL (ref 30.0–36.0)
Monocytes Relative: 7.5 % (ref 3.0–12.0)
Platelets: 255 10*3/uL (ref 150.0–400.0)
RDW: 13.3 % (ref 11.5–14.6)

## 2012-06-01 LAB — BASIC METABOLIC PANEL
BUN: 15 mg/dL (ref 6–23)
Calcium: 8.9 mg/dL (ref 8.4–10.5)
GFR: 97.39 mL/min (ref >60.00)
Glucose, Bld: 81 mg/dL (ref 70–99)

## 2012-06-01 LAB — LIPID PANEL
Cholesterol: 166 mg/dL (ref 0–200)
HDL: 59.5 mg/dL (ref >39.00)
LDL Cholesterol: 82 mg/dL (ref 0–99)
Triglycerides: 121 mg/dL (ref 0.0–149.0)
VLDL: 24.2 mg/dL (ref 0.0–40.0)

## 2012-06-01 LAB — TSH: TSH: 2.76 u[IU]/mL (ref 0.35–5.50)

## 2012-06-01 LAB — HEPATIC FUNCTION PANEL
AST: 23 U/L (ref 0–37)
Total Bilirubin: 0.8 mg/dL (ref 0.3–1.2)

## 2012-06-01 MED ORDER — PRAVASTATIN SODIUM 40 MG PO TABS: ORAL_TABLET | ORAL | Status: DC

## 2012-06-01 MED ORDER — LORAZEPAM 0.5 MG PO TABS: ORAL_TABLET | ORAL | Status: DC

## 2012-06-01 NOTE — Patient Instructions (Addendum)
Preventive Health Care: Exercise at least 30-45 minutes a day,  3-4 days a week.  Eat a low-fat diet with lots of fruits and vegetables, up to 7-9 servings per day.  Please try to go on My Chart within the next 24 hours to allow me to release the results directly to you.

## 2012-06-01 NOTE — Progress Notes (Signed)
  Subjective:    Patient ID: [name] [name], male    DOB: July 08, 1966, 46 y.o.   MRN: [id number]  HPI  [name] [name] is here for a physical; he has no acute issues except difficulty going to sleep.      Review of Systems His reflux is well controlled with as needed ranitidine and eliminating excess beer his diet. He specifically denies unexplained weight loss, dysphagia, melena, or rectal bleeding. There's been no recurrence of the hematuria after he took a course of antibiotics.     Objective:   Physical Exam Gen.: Healthy and well-nourished in appearance. Alert, appropriate and cooperative throughout exam. Head: Normocephalic without obvious abnormalities  Eyes: No corneal or conjunctival inflammation noted. Pupils equal round reactive to light and accommodation. Fundal exam is benign without hemorrhages, exudate, papilledema. Extraocular motion intact. Vision grossly normal. Ears: External  ear exam reveals no significant lesions or deformities. Canals clear .TMs normal. Hearing is grossly normal bilaterally. Nose: External nasal exam reveals no deformity or inflammation. Nasal mucosa are pink and moist. No lesions or exudates noted.  Mouth: Oral mucosa and oropharynx reveal no lesions or exudates. Teeth in good repair. Neck: No deformities, masses, or tenderness noted. Range of motion & Thyroid normal. Lungs: Normal respiratory effort; chest expands symmetrically. Lungs are clear to auscultation without rales, wheezes, or increased work of breathing. Heart: Normal rate and rhythm. Normal S1 and S2. No gallop, click, or rub. S 4 @ apex w/o murmur. Abdomen: Bowel sounds normal; abdomen soft and nontender. No masses, organomegaly or hernias noted. 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  Prostate is normal without enlargement, asymmetry, nodularity, or induration.                                                 Musculoskeletal/extremities: No deformity or scoliosis noted  of  the thoracic or lumbar spine. No clubbing, cyanosis, edema, or deformity noted. Range of motion  normal .Tone & strength  normal.Joints normal. Nail health  good. Vascular: Carotid, radial artery, dorsalis pedis and  posterior tibial pulses are full and equal. No bruits present. Neurologic: Alert and oriented x3. Deep tendon reflexes symmetrical and normal.          Skin: Intact without suspicious lesions or rashes. Lymph: No cervical, axillary, or inguinal lymphadenopathy present. Psych: Mood and affect are normal. Normally interactive                                                                                         Assessment & Plan:  #1 comprehensive physical exam; no acute findings #2 see Problem List with Assessments & Recommendations Plan: see Orders

## 2012-06-03 ENCOUNTER — Telehealth: Payer: Self-pay | Admitting: Internal Medicine

## 2012-06-03 DIAGNOSIS — G479 Sleep disorder, unspecified: Secondary | ICD-10-CM

## 2012-06-03 MED ORDER — LORAZEPAM 0.5 MG PO TABS: ORAL_TABLET | ORAL | Status: DC

## 2012-06-03 MED ORDER — BUPROPION HCL ER (SR) 150 MG PO TB12: ORAL_TABLET | ORAL | Status: DC

## 2012-06-03 NOTE — Telephone Encounter (Signed)
Lorazepam was called in, Wellbutrin was sent over electronically

## 2012-06-03 NOTE — Telephone Encounter (Signed)
Pt states the pharmacy did not receive his rx for lorazepam, but they did receive the pravastatin. He also needs a refill on his bupropion150 MG 12 hr tablet. He is switching from [facility] to [employer] and thinks this may be why he's having trouble.

## 2012-09-02 ENCOUNTER — Other Ambulatory Visit: Payer: Self-pay | Admitting: Internal Medicine

## 2012-09-03 NOTE — Telephone Encounter (Signed)
OV 06/01/12. Ativan Last filled 06/03/12 #30 x 2   PLz advise    MW

## 2012-09-03 NOTE — Telephone Encounter (Signed)
Done

## 2012-10-03 ENCOUNTER — Other Ambulatory Visit: Payer: Self-pay | Admitting: Internal Medicine

## 2012-10-04 NOTE — Telephone Encounter (Signed)
Ok to refill 

## 2012-10-30 ENCOUNTER — Other Ambulatory Visit: Payer: Self-pay | Admitting: Internal Medicine

## 2012-11-01 NOTE — Telephone Encounter (Signed)
Patient aware to stop by the office to sign contract to receive rx

## 2012-12-01 ENCOUNTER — Other Ambulatory Visit: Payer: Self-pay | Admitting: Internal Medicine

## 2012-12-02 NOTE — Telephone Encounter (Signed)
OK R X 2 

## 2012-12-02 NOTE — Telephone Encounter (Signed)
Last filled 10/30/2012, patient taking medication every night, ? Ok to give additional refills

## 2012-12-20 ENCOUNTER — Telehealth: Payer: Self-pay | Admitting: Internal Medicine

## 2012-12-20 NOTE — Telephone Encounter (Signed)
Patient Information:  Caller Name: [name] [name]  Phone: [phone]  Patient: [name] [name]  Gender: Male  DOB: 1966/08/17  Age: 47 Years  PCP: Marga Melnick  Office Follow Up:  Does the office need to follow up with this patient?: No  Instructions For The Office: N/A  RN Note:  Triaged per Nausea /Vomiting Guideline ([facility]) and homecare dispostion due to all triage questions negative.  Per practice orders, Phenergan can not be called in unless there are no appt for pt to be seen within the recommended  time frame.  Pt must be seen for Rx request per practice orders.  Offered appt but pt states that he will wait and see how he is tomorrow and will call back if sx worsen.  Homecare advice given.  Symptoms  Reason For Call & Symptoms: Pt is calling and states that he is very nauseated; no vomiting; no diarrhea; stomach bug going through the house; pt is requesting phenergan for the nausea;  Reviewed Health History In EMR: Yes  Reviewed Medications In EMR: Yes  Reviewed Allergies In EMR: Yes  Reviewed Surgeries / Procedures: Yes  Date of Onset of Symptoms: 12/20/2012  Guideline(s) Used:  No Protocol Available - Sick Adult  Disposition Per Guideline:   Home Care  Reason For Disposition Reached:   Patient's symptoms are safe to treat at home per nursing judgment  Advice Given:  Call Back If:  New symptoms develop  You become worse.

## 2012-12-25 ENCOUNTER — Other Ambulatory Visit: Payer: Self-pay

## 2013-01-20 ENCOUNTER — Ambulatory Visit: Payer: PRIVATE HEALTH INSURANCE | Admitting: Internal Medicine

## 2013-01-20 ENCOUNTER — Encounter: Payer: Self-pay | Admitting: Internal Medicine

## 2013-01-20 ENCOUNTER — Ambulatory Visit (INDEPENDENT_AMBULATORY_CARE_PROVIDER_SITE_OTHER): Payer: No Typology Code available for payment source | Admitting: Internal Medicine

## 2013-01-20 VITALS — BP 138/92 | HR 87 | Temp 97.5°F | Wt 174.0 lb

## 2013-01-20 DIAGNOSIS — F329 Major depressive disorder, single episode, unspecified: Secondary | ICD-10-CM

## 2013-01-20 DIAGNOSIS — E782 Mixed hyperlipidemia: Secondary | ICD-10-CM

## 2013-01-20 DIAGNOSIS — F341 Dysthymic disorder: Secondary | ICD-10-CM

## 2013-01-20 LAB — LIPID PANEL
Cholesterol: 186 mg/dL (ref 0–200)
HDL: 64.7 mg/dL (ref >39.00)
LDL Cholesterol: 100 mg/dL — ABNORMAL HIGH (ref 0–99)
VLDL: 21 mg/dL (ref 0.0–40.0)

## 2013-01-20 LAB — HEPATIC FUNCTION PANEL
Albumin: 4 g/dL (ref 3.5–5.2)
Alkaline Phosphatase: 64 U/L (ref 39–117)
Total Protein: 7.3 g/dL (ref 6.0–8.3)

## 2013-01-20 LAB — TSH: TSH: 1.52 u[IU]/mL (ref 0.35–5.50)

## 2013-01-20 MED ORDER — VILAZODONE HCL 20 MG PO TABS: 20.0000 mg | ORAL_TABLET | Freq: Every morning | ORAL | Status: DC

## 2013-01-20 NOTE — Progress Notes (Signed)
  Subjective:    Patient ID: [name] [name], male    DOB: 01/31/1966, 47 y.o.   MRN: [id number]  HPI  He describes situational depression with some irritability and difficulty triaging. He has been on bupropion &  lorazepam as needed. He makes a comment that the bupropion has helped his psoriasis.  He has lost 5 pounds in the context of some anorexia.  There is no personal or family history of depression, alcohol abuse, or medication abuse.    Review of Systems He stopped his statin 6 months ago he has concerns about possible relationship to ALS. He is on a heart healthy diet; he exercises 60-75 minutes 3 times per week without symptoms. Specifically he denies chest pain, palpitations, dyspnea, or claudication. Family history is negative for premature coronary disease; his father had stenting @ 71. Advanced cholesterol testing reveals his LDL goal was less than 130, ideally < 100.    Objective:   Physical Exam Appears healthy and well-nourished & in no acute distress  No carotid bruits are present. Thyroid normal to palpation  Heart rhythm and rate are normal with no significant murmurs or gallops. S4  Chest is clear with no increased work of breathing  No clubbing, cyanosis or edema present.  Pedal pulses are intact   No ischemic skin changes are present . Nails healthy ; no onycholysis  Alert and oriented. Strength, tone, DTRs reflexes normal          Assessment & Plan:  #1situational depression #2 dyslipidemia Plan: See orders and recommendations

## 2013-01-20 NOTE — Patient Instructions (Addendum)
Review and correct the record as indicated. Please share record with all medical staff seen.  

## 2013-01-25 ENCOUNTER — Ambulatory Visit (INDEPENDENT_AMBULATORY_CARE_PROVIDER_SITE_OTHER): Payer: No Typology Code available for payment source | Admitting: Licensed Clinical Social Worker

## 2013-01-25 DIAGNOSIS — F39 Unspecified mood [affective] disorder: Secondary | ICD-10-CM

## 2013-02-08 ENCOUNTER — Ambulatory Visit (INDEPENDENT_AMBULATORY_CARE_PROVIDER_SITE_OTHER): Payer: No Typology Code available for payment source | Admitting: Licensed Clinical Social Worker

## 2013-02-08 DIAGNOSIS — F39 Unspecified mood [affective] disorder: Secondary | ICD-10-CM

## 2013-03-08 ENCOUNTER — Ambulatory Visit: Payer: No Typology Code available for payment source | Admitting: Licensed Clinical Social Worker

## 2013-03-11 ENCOUNTER — Other Ambulatory Visit: Payer: Self-pay | Admitting: Internal Medicine

## 2013-03-14 ENCOUNTER — Telehealth: Payer: Self-pay | Admitting: *Deleted

## 2013-03-14 NOTE — Telephone Encounter (Signed)
Pt left VM that he had some questions about a refill and would like to get a call back. .Left message to call office

## 2013-03-15 ENCOUNTER — Telehealth: Payer: Self-pay | Admitting: Internal Medicine

## 2013-03-15 NOTE — Telephone Encounter (Signed)
Called an LMOVM notifying pt per his request.

## 2013-03-15 NOTE — Telephone Encounter (Signed)
Pt calling about Vilazodone HCl (VIIBRYD) 20 MG TABS would like to talk with you.

## 2013-03-15 NOTE — Telephone Encounter (Signed)
Please pick up samples; I'll talk to Pharm Rep about discount cards if available

## 2013-03-15 NOTE — Telephone Encounter (Signed)
Pt call returned. States that VIIBryd is a very expensive medication. Would like to know if there is another cost effective alternative? If not could he have another round of samples to help him with cost? Pt has only been taking 10mg  (states this has been working great for him) and  has been breaking up the 20mg  and 40 mg to make 10 mg equivalents. Please advise.   [facility]

## 2013-03-16 NOTE — Telephone Encounter (Signed)
Left message to call office

## 2013-03-22 NOTE — Telephone Encounter (Signed)
See other encounter.

## 2013-04-21 ENCOUNTER — Telehealth: Payer: Self-pay | Admitting: *Deleted

## 2013-04-21 NOTE — Telephone Encounter (Signed)
Prior Auth initiated over the phone awaiting response.

## 2013-04-27 NOTE — Telephone Encounter (Signed)
Advise patient, his insurance did not ok Vivrid; his options include: Pay out of pocket if he likes the medication Switch to one of the other suggested medications. I'm not opposed to switch to Fluoxetine 20 mg one tablet daily. If he is in agreement, call 30 and one refill, schedule office visit with PCP within a month.

## 2013-04-27 NOTE — Telephone Encounter (Signed)
Prior Auth Denied because we do not have enough information,your request cannot be approved. Med can be approved if you can show records you have tried and failed two generic antidepressants within the past 180 days including bupropion, citalopram, fluoxetine, fluvoxamine, paroxetine, paroxetine CR, sertraline, and venlafaxine. .Please advise

## 2013-04-27 NOTE — Telephone Encounter (Signed)
Left message to call office

## 2013-04-28 MED ORDER — FLUOXETINE HCL 20 MG PO TABS: 20.0000 mg | ORAL_TABLET | Freq: Every day | ORAL | Status: DC

## 2013-04-28 NOTE — Telephone Encounter (Signed)
Discuss with patient, Rx sent. 

## 2013-05-30 ENCOUNTER — Ambulatory Visit (INDEPENDENT_AMBULATORY_CARE_PROVIDER_SITE_OTHER): Payer: BC Managed Care – PPO | Admitting: Internal Medicine

## 2013-05-30 ENCOUNTER — Encounter: Payer: Self-pay | Admitting: Internal Medicine

## 2013-05-30 VITALS — BP 132/90 | HR 88 | Wt 178.0 lb

## 2013-05-30 DIAGNOSIS — K589 Irritable bowel syndrome without diarrhea: Secondary | ICD-10-CM

## 2013-05-30 DIAGNOSIS — F4323 Adjustment disorder with mixed anxiety and depressed mood: Secondary | ICD-10-CM | POA: Insufficient documentation

## 2013-05-30 MED ORDER — VILAZODONE HCL 20 MG PO TABS: 20.0000 mg | ORAL_TABLET | Freq: Every morning | ORAL | Status: DC

## 2013-05-30 NOTE — Progress Notes (Signed)
  Subjective:    Patient ID: [name] [name], male    DOB: Feb 08, 1966, 47 y.o.   MRN: [id number]  HPI He did not find fluoxetine or Wellbutrin to be of benefit. He has found Viibryd 20 mg one half pill daily to be beneficial for mood control. Anxiety & depression improved 30-40% with Viibryd. No loss of interest, panic attacks. One bottle of wine over 5 nights.  He remains off his statin.He is on a modified heart healthy diet; he exercises 60-90 minutes 3 times per week without symptoms. Specifically he denies chest pain, palpitations, dyspnea, or claudication. Family history is negative for premature coronary disease. Advanced cholesterol testing reveals his LDL goal is less than 130.    Review of Systems   Constitutional: No significant change in weight. Sleep disruption due to stress. No excess fatigue or  weakness Cardiovascular: No palpitations, racing, irregularity, sweating Gastrointestinal: No nausea or vomiting. Variable stool consistency Neuro: No tremor, mental status change,headache  Endocrine: No change in skin/hair/nails        Objective:   Physical Exam Gen.: Thin but healthy and well-nourished in appearance. Alert, appropriate and cooperative throughout exam.  Eyes: No corneal or conjunctival inflammation noted. No lid lag , proptosis or nystagmus. Extraocular motion intact. Vision grossly normal with lenses  Neck: No deformities, masses, or tenderness noted.  Thyroid normal. Lungs: Normal respiratory effort; chest expands symmetrically. Lungs are clear to auscultation without rales, wheezes, or increased work of breathing. Heart: Normal rate and rhythm. Normal S1 and S2. No gallop, click, or rub. S4 & murmur.                                 Musculoskeletal/extremities:  No clubbing, cyanosis, edema, or significant extremity  deformity noted. Range of motion normal .Tone & strength  Normal. Joints normal . Psoriatic nail health good. Neurologic: Alert and oriented x3.  Deep tendon reflexes symmetrical and normal.      Skin: Intact without suspicious lesions or rashes. Lymph: No cervical, axillary lymphadenopathy present. Psych: Mood and affect are normal. Normally interactive                                                                                        Assessment & Plan:  See Current Assessment & Plan in Problem List under specific Diagnosis

## 2013-05-30 NOTE — Patient Instructions (Addendum)
Please take the probiotic  every day until the bowels are normal. This will replace the normal bacteria which  are necessary for formation of normal stool and processing of food.

## 2013-05-30 NOTE — Assessment & Plan Note (Signed)
Probiotic recommended

## 2013-06-04 ENCOUNTER — Other Ambulatory Visit: Payer: Self-pay | Admitting: Internal Medicine

## 2013-06-27 ENCOUNTER — Telehealth: Payer: Self-pay

## 2013-06-27 MED ORDER — LORAZEPAM 0.5 MG PO TABS: ORAL_TABLET | ORAL | Status: DC

## 2013-06-27 NOTE — Telephone Encounter (Signed)
Message left on triage voicemail @ 3:06 pm Friday. Patient would like a refill for Lorazepam sent to [facility]. Last filled 03/11/2013 #30/1, last OV 05/30/2013, contract was signed 10/22/12- NO UDS on FILE  **Patient will need to collect UDS**  Hopp please advise

## 2013-06-27 NOTE — Telephone Encounter (Signed)
#   30, R X 2 

## 2013-06-27 NOTE — Telephone Encounter (Signed)
Rx was refilled per Dr. Alwyn Ren  Ag [facility]

## 2013-09-15 ENCOUNTER — Other Ambulatory Visit: Payer: Self-pay

## 2013-10-18 ENCOUNTER — Emergency Department (HOSPITAL_COMMUNITY)
Admission: EM | Admit: 2013-10-18 | Discharge: 2013-10-19 | Disposition: A | Discharge status: Home / Self Care | Payer: BC Managed Care – PPO | Attending: Emergency Medicine | Admitting: Emergency Medicine

## 2013-10-18 ENCOUNTER — Encounter (HOSPITAL_COMMUNITY): Payer: Self-pay | Admitting: Emergency Medicine

## 2013-10-18 ENCOUNTER — Emergency Department (HOSPITAL_COMMUNITY): Payer: BC Managed Care – PPO

## 2013-10-18 DIAGNOSIS — Z862 Personal history of diseases of the blood and blood-forming organs and certain disorders involving the immune mechanism: Secondary | ICD-10-CM | POA: Insufficient documentation

## 2013-10-18 DIAGNOSIS — N201 Calculus of ureter: Secondary | ICD-10-CM | POA: Insufficient documentation

## 2013-10-18 DIAGNOSIS — Z8719 Personal history of other diseases of the digestive system: Secondary | ICD-10-CM | POA: Insufficient documentation

## 2013-10-18 DIAGNOSIS — N132 Hydronephrosis with renal and ureteral calculous obstruction: Secondary | ICD-10-CM

## 2013-10-18 DIAGNOSIS — R112 Nausea with vomiting, unspecified: Secondary | ICD-10-CM | POA: Insufficient documentation

## 2013-10-18 DIAGNOSIS — Z8639 Personal history of other endocrine, nutritional and metabolic disease: Secondary | ICD-10-CM | POA: Insufficient documentation

## 2013-10-18 DIAGNOSIS — Z7982 Long term (current) use of aspirin: Secondary | ICD-10-CM | POA: Insufficient documentation

## 2013-10-18 DIAGNOSIS — Z872 Personal history of diseases of the skin and subcutaneous tissue: Secondary | ICD-10-CM | POA: Insufficient documentation

## 2013-10-18 DIAGNOSIS — N1330 Unspecified hydronephrosis: Secondary | ICD-10-CM | POA: Insufficient documentation

## 2013-10-18 DIAGNOSIS — Z79899 Other long term (current) drug therapy: Secondary | ICD-10-CM | POA: Insufficient documentation

## 2013-10-18 DIAGNOSIS — R52 Pain, unspecified: Secondary | ICD-10-CM | POA: Insufficient documentation

## 2013-10-18 LAB — CBC WITH DIFFERENTIAL/PLATELET
Basophils Absolute: 0 10*3/uL (ref 0.0–0.1)
Eosinophils Relative: 0 % (ref 0–5)
HCT: 42.5 % (ref 39.0–52.0)
Hemoglobin: 15.1 g/dL (ref 13.0–17.0)
Lymphocytes Relative: 14 % (ref 12–46)
Lymphs Abs: 2 10*3/uL (ref 0.7–4.0)
MCH: 31.3 pg (ref 26.0–34.0)
MCHC: 35.5 g/dL (ref 30.0–36.0)
MCV: 88 fL (ref 78.0–100.0)
Monocytes Absolute: 0.6 10*3/uL (ref 0.1–1.0)
Monocytes Relative: 4 % (ref 3–12)
Platelets: 276 10*3/uL (ref 150–400)
RBC: 4.83 MIL/uL (ref 4.22–5.81)
RDW: 12.4 % (ref 11.5–15.5)
WBC: 13.8 10*3/uL — ABNORMAL HIGH (ref 4.0–10.5)

## 2013-10-18 LAB — CG4 I-STAT (LACTIC ACID): Lactic Acid, Venous: 3.32 mmol/L — ABNORMAL HIGH (ref 0.5–2.2)

## 2013-10-18 MED ORDER — ONDANSETRON HCL 4 MG/2ML IJ SOLN
4.0000 mg | Freq: Once | INTRAMUSCULAR | Status: AC
  Administered 2013-10-18: 4 mg via INTRAVENOUS
  Filled 2013-10-18: qty 2

## 2013-10-18 MED ORDER — MORPHINE SULFATE 4 MG/ML IJ SOLN
4.0000 mg | Freq: Once | INTRAMUSCULAR | Status: AC
  Administered 2013-10-18: 4 mg via INTRAVENOUS
  Filled 2013-10-18: qty 1

## 2013-10-18 MED ORDER — SODIUM CHLORIDE 0.9 % IV BOLUS (SEPSIS)
1000.0000 mL | Freq: Once | INTRAVENOUS | Status: AC
  Administered 2013-10-18: 1000 mL via INTRAVENOUS

## 2013-10-18 MED ORDER — IOHEXOL 300 MG/ML  SOLN
50.0000 mL | Freq: Once | INTRAMUSCULAR | Status: AC | PRN
  Administered 2013-10-18: 50 mL via ORAL

## 2013-10-18 NOTE — ED Notes (Signed)
Patient is alert and oriented x3.  He is complaining of abdominal pain that started 3 hours ago. He denies any history of this issue before.  Currently he rates his pain 7 of 10 with nausea.   He states that his pain is localized to the LLQ and does not radiate.  He has had BM that he States is normal with no bleeding.

## 2013-10-19 ENCOUNTER — Encounter (HOSPITAL_COMMUNITY): Payer: Self-pay

## 2013-10-19 ENCOUNTER — Emergency Department (HOSPITAL_COMMUNITY): Payer: BC Managed Care – PPO

## 2013-10-19 LAB — COMPREHENSIVE METABOLIC PANEL
AST: 31 U/L (ref 0–37)
BUN: 20 mg/dL (ref 6–23)
CO2: 21 mEq/L (ref 19–32)
Calcium: 9.9 mg/dL (ref 8.4–10.5)
Creatinine, Ser: 1.15 mg/dL (ref 0.50–1.35)
GFR calc Af Amer: 85 mL/min — ABNORMAL LOW (ref >90)
GFR calc non Af Amer: 74 mL/min — ABNORMAL LOW (ref >90)
Glucose, Bld: 157 mg/dL — ABNORMAL HIGH (ref 70–99)
Total Bilirubin: 0.4 mg/dL (ref 0.3–1.2)
Total Protein: 7.8 g/dL (ref 6.0–8.3)

## 2013-10-19 MED ORDER — IOHEXOL 300 MG/ML  SOLN
100.0000 mL | Freq: Once | INTRAMUSCULAR | Status: AC | PRN
  Administered 2013-10-19: 100 mL via INTRAVENOUS

## 2013-10-19 MED ORDER — OXYCODONE-ACETAMINOPHEN 5-325 MG PO TABS: 2.0000 | ORAL_TABLET | ORAL | Status: DC | PRN

## 2013-10-19 MED ORDER — ONDANSETRON HCL 4 MG PO TABS: 4.0000 mg | ORAL_TABLET | Freq: Four times a day (QID) | ORAL | Status: DC

## 2013-10-19 MED ORDER — TAMSULOSIN HCL 0.4 MG PO CAPS: 0.4000 mg | ORAL_CAPSULE | Freq: Every day | ORAL | Status: DC

## 2013-10-19 NOTE — ED Provider Notes (Signed)
CSN: [id number]     Arrival date & time 10/18/13  2134 History   First MD Initiated Contact with Patient 10/18/13 2146     Chief Complaint  Patient presents with  . Abdominal Pain   (Consider location/radiation/quality/duration/timing/severity/associated sxs/prior Treatment) HPI  This is a 47 year old male with no significant past medical history who presents with abdominal pain. Patient had acute onset of left lower quadrant abdominal pain 3 hours prior to arrival. It is nonradiating. He reports that it is 7/10. He has had nausea with nonbilious, nonbloody emesis.  He denies any history of the same. He reports normal bowel movements. He denies any blood in his stools. He denies any recent fevers.  Past Medical History  Diagnosis Date  . Seborrheic dermatitis   . Hyperlipemia   . GERD (gastroesophageal reflux disease)    Past Surgical History  Procedure Laterality Date  . Upper gastrointestinal endoscopy  2008    esophageal stricture ; Dr Arlyce Dice  . Vasectomy  2008    Dr Logan Bores  . Orchiectomy  1999    Nonmalignant mass; mildly elevated tumor marker preop   Family History  Problem Relation Age of Onset  . Hyperlipidemia Mother   . Heart disease Father 53    stent   . Heart failure Maternal Grandfather   . Heart failure Maternal Grandmother   . Cancer Neg Hx   . Diabetes Neg Hx   . Stroke Neg Hx   . Depression Neg Hx   . Alcohol abuse Neg Hx    History  Substance Use Topics  . Smoking status: Never Smoker   . Smokeless tobacco: Not on file  . Alcohol Use: 6.0 oz/week    10 Glasses of wine per week     Comment: socially    Review of Systems  Constitutional: Negative.  Negative for fever.  Respiratory: Negative.  Negative for chest tightness and shortness of breath.   Cardiovascular: Negative.  Negative for chest pain.  Gastrointestinal: Positive for nausea, vomiting and abdominal pain. Negative for diarrhea.  Genitourinary: Negative.  Negative for dysuria.   Musculoskeletal: Negative for back pain.  Skin: Negative for rash.  Neurological: Negative for headaches.  All other systems reviewed and are negative.    Allergies  Review of patient's allergies indicates no known allergies.  Home Medications   Current Outpatient Rx  Name  Route  Sig  Dispense  Refill  . aspirin 81 MG tablet   Oral   Take 81 mg by mouth daily.           . Coenzyme Q10 (COQ10) 100 MG CAPS   Oral   Take by mouth.           . doxepin (SINEQUAN) 10 MG capsule   Oral   Take 10 mg by mouth once.         . finasteride (PROSCAR) 5 MG tablet   Oral   Take 5 mg by mouth daily.         Marland Kitchen LORazepam (ATIVAN) 0.5 MG tablet      TAKE 1 TABLET BY MOUTH AT BEDTIME PREFERABLY EVERY 3RD NIGHT AS NEEDED   30 tablet   2   . Multiple Vitamin (MULTIVITAMIN) tablet   Oral   Take 1 tablet by mouth daily.           . Omega-3 Fatty Acids (FISH OIL PO)   Oral   Take 2,000 mg by mouth.           Marland Kitchen  Vilazodone HCl (VIIBRYD) 20 MG TABS   Oral   Take 1 tablet (20 mg total) by mouth every morning.   30 tablet   5    BP 132/78  Pulse 100  Temp(Src) 97.7 F (36.5 C) (Oral)  Resp 16  SpO2 97% Physical Exam  Nursing note and vitals reviewed. Constitutional: He is oriented to person, place, and time.  Pale-appearing, vomiting, uncomfortable appearing  HENT:  Head: Normocephalic and atraumatic.  Eyes: Pupils are equal, round, and reactive to light.  Cardiovascular: Normal rate, regular rhythm and normal heart sounds.   No murmur heard. Pulmonary/Chest: Effort normal and breath sounds normal. No respiratory distress. He has no wheezes.  Abdominal: Soft. Bowel sounds are normal. There is tenderness. There is no rebound.  Tenderness palpation of the left lower quadrant without rebound or guarding  Musculoskeletal: He exhibits no edema.  Lymphadenopathy:    He has no cervical adenopathy.  Neurological: He is alert and oriented to person, place, and time.   Skin: Skin is warm and dry.  Psychiatric: He has a normal mood and affect.    ED Course  Procedures (including critical care time) Labs Review Labs Reviewed  CBC WITH DIFFERENTIAL - Abnormal; Notable for the following:    WBC 13.8 (*)    Neutrophils Relative % 81 (*)    Neutro Abs 11.2 (*)    All other components within normal limits  COMPREHENSIVE METABOLIC PANEL - Abnormal; Notable for the following:    Glucose, Bld 157 (*)    GFR calc non Af Amer 74 (*)    GFR calc Af Amer 85 (*)    All other components within normal limits  CG4 I-STAT (LACTIC ACID) - Abnormal; Notable for the following:    Lactic Acid, Venous 3.32 (*)    All other components within normal limits  LIPASE, BLOOD   Imaging Review No results found.  EKG Interpretation   None       MDM  No diagnosis found.  Patient presents with abdominal pain and vomiting. Lab work so far is notable for a lactate of 3.3 and white count of 13.8. Patient was given normal saline, morphine, and Zofran for symptomatic relief. CT scan of the abdomen and pelvis is pending. Patient signed out to Dr. Dierdre Highman.    Shon Baton, MD 10/19/13 [phone]

## 2013-10-19 NOTE — ED Provider Notes (Signed)
2:23 AM pain free and wants to go home. No further emesis. No fevers. UA pending - PT prefers to be discharged.  CT reviewed - plan Urology referral, RX percocet, zofran, flomax.  Return precautions provided and verbalized as understood.   Results for orders placed during the hospital encounter of 10/18/13  CBC WITH DIFFERENTIAL      Result Value Range   WBC 13.8 (*) 4.0 - 10.5 K/uL   RBC 4.83  4.22 - 5.81 MIL/uL   Hemoglobin 15.1  13.0 - 17.0 g/dL   HCT 08.6  57.8 - 46.9 %   MCV 88.0  78.0 - 100.0 fL   MCH 31.3  26.0 - 34.0 pg   MCHC 35.5  30.0 - 36.0 g/dL   RDW 62.9  52.8 - 41.3 %   Platelets 276  150 - 400 K/uL   Neutrophils Relative % 81 (*) 43 - 77 %   Neutro Abs 11.2 (*) 1.7 - 7.7 K/uL   Lymphocytes Relative 14  12 - 46 %   Lymphs Abs 2.0  0.7 - 4.0 K/uL   Monocytes Relative 4  3 - 12 %   Monocytes Absolute 0.6  0.1 - 1.0 K/uL   Eosinophils Relative 0  0 - 5 %   Eosinophils Absolute 0.0  0.0 - 0.7 K/uL   Basophils Relative 0  0 - 1 %   Basophils Absolute 0.0  0.0 - 0.1 K/uL  COMPREHENSIVE METABOLIC PANEL      Result Value Range   Sodium 136  135 - 145 mEq/L   Potassium 4.3  3.5 - 5.1 mEq/L   Chloride 99  96 - 112 mEq/L   CO2 21  19 - 32 mEq/L   Glucose, Bld 157 (*) 70 - 99 mg/dL   BUN 20  6 - 23 mg/dL   Creatinine, Ser 2.44  0.50 - 1.35 mg/dL   Calcium 9.9  8.4 - 01.0 mg/dL   Total Protein 7.8  6.0 - 8.3 g/dL   Albumin 4.3  3.5 - 5.2 g/dL   AST 31  0 - 37 U/L   ALT 43  0 - 53 U/L   Alkaline Phosphatase 77  39 - 117 U/L   Total Bilirubin 0.4  0.3 - 1.2 mg/dL   GFR calc non Af Amer 74 (*) >90 mL/min   GFR calc Af Amer 85 (*) >90 mL/min  LIPASE, BLOOD      Result Value Range   Lipase 29  11 - 59 U/L  CG4 I-STAT (LACTIC ACID)      Result Value Range   Lactic Acid, Venous 3.32 (*) 0.5 - 2.2 mmol/L   Ct Abdomen Pelvis W Contrast  10/19/2013   CLINICAL DATA:  Left lower quadrant pain. History of gastroesophageal reflux disease.  EXAM: CT ABDOMEN AND PELVIS WITH  CONTRAST  TECHNIQUE: Multidetector CT imaging of the abdomen and pelvis was performed using the standard protocol following bolus administration of intravenous contrast.  CONTRAST:  OMNIPAQUE IOHEXOL 300 MG/ML  SOLN  COMPARISON:  None available for comparison at time of study interpretation.  FINDINGS: Included view of the lung bases are clear. The visualized heart and pericardium are unremarkable.  Kidneys are orthotopic, demonstrating normal size and morphology. Mildly delayed nephrogram on the left. Mild left hydroureteronephrosis to the level the proximal ureter where a 5 mm calculus is seen. Trace left perinephric stranding without focal fluid collections. Too small to characterize hypodensity in upper pole left kidney, with an adjacent  2 mm calculus. Delayed imaging demonstrates asymmetric, right greater than left contrast enhancement to the proximal urinary collecting system. Urinary bladder is partially distended with very mild circumferential wall thickening. No intravesicular calculi.  The liver, spleen, gallbladder, pancreas and adrenal glands are unremarkable scratch data.  The stomach, small and large bowel are normal in course and caliber without inflammatory changes, the sensitivity may be decreased by lack of enteric contrast. Normal appendix. No intraperitoneal free fluid nor free air.  Great vessels are normal in course and caliber with mild calcific atherosclerosis. No lymphadenopathy by CT size criteria. Internal reproductive organs are unremarkable. The soft tissues and included osseous structures are nonsuspicious.  IMPRESSION: Mild left hydroureteronephrosis to the proximal ureter where a 5 mm calculus is seen. Mildly delayed left renal function. Nonspecific mild left perinephric stranding without focal fluid collection. 2 mm left upper pole renal calculus associated with a too small to characterize hypodensity.   Electronically Signed   By: Awilda Metro   On: 10/19/2013 01:06       Sunnie Nielsen, MD 10/19/13 0225

## 2013-11-10 DIAGNOSIS — Z87442 Personal history of urinary calculi: Secondary | ICD-10-CM

## 2013-11-10 HISTORY — DX: Personal history of urinary calculi: Z87.442

## 2013-11-18 ENCOUNTER — Telehealth: Payer: Self-pay | Admitting: *Deleted

## 2013-11-18 MED ORDER — LORAZEPAM 0.5 MG PO TABS: ORAL_TABLET | ORAL | Status: DC

## 2013-11-18 NOTE — Telephone Encounter (Signed)
OK X1, R X 2 

## 2013-11-18 NOTE — Telephone Encounter (Signed)
Med filled.  

## 2013-11-18 NOTE — Telephone Encounter (Signed)
Patient requesting refill on Lorazepam.   Last seen-05/30/2013  Last filled- 06/17/2013  UDS-not on file, contract signed   Please advise. SW

## 2013-11-18 NOTE — Addendum Note (Signed)
Addended by: Beckie Busing on: 11/18/2013 10:35 AM   Modules accepted: Orders

## 2014-03-01 ENCOUNTER — Other Ambulatory Visit: Payer: Self-pay

## 2014-03-01 MED ORDER — LORAZEPAM 0.5 MG PO TABS: ORAL_TABLET | ORAL | Status: DC

## 2014-03-01 NOTE — Telephone Encounter (Signed)
OK X1 

## 2014-03-01 NOTE — Telephone Encounter (Signed)
Last ov 05/30/13 Med last filled 11/18/13 #30 plus 2 refills

## 2014-04-05 ENCOUNTER — Other Ambulatory Visit: Payer: Self-pay

## 2014-04-05 MED ORDER — LORAZEPAM 0.5 MG PO TABS: ORAL_TABLET | ORAL | Status: DC

## 2014-04-05 NOTE — Telephone Encounter (Signed)
Last ov 05/30/13 Med last filled 03/01/14

## 2014-04-05 NOTE — Telephone Encounter (Signed)
OK X1 

## 2014-05-08 ENCOUNTER — Other Ambulatory Visit: Payer: Self-pay

## 2014-05-08 MED ORDER — LORAZEPAM 0.5 MG PO TABS: ORAL_TABLET | ORAL | Status: DC

## 2014-05-08 NOTE — Telephone Encounter (Signed)
OK X1 

## 2014-06-13 ENCOUNTER — Other Ambulatory Visit: Payer: Self-pay

## 2014-06-13 MED ORDER — LORAZEPAM 0.5 MG PO TABS: ORAL_TABLET | ORAL | Status: DC

## 2014-06-13 NOTE — Telephone Encounter (Signed)
05/30/13 last ov 05/08/14 last refill #30

## 2014-06-13 NOTE — Telephone Encounter (Signed)
Ok X1 

## 2014-08-10 ENCOUNTER — Telehealth: Payer: Self-pay | Admitting: *Deleted

## 2014-08-10 MED ORDER — LORAZEPAM 0.5 MG PO TABS: ORAL_TABLET | ORAL | Status: DC

## 2014-08-10 NOTE — Telephone Encounter (Signed)
Left msg on triage stating fax over refill request for pt lorazepam 0.5 mg # 30. Last filled 06/13/14 Pt is requesting refill today...[name] [name]

## 2014-08-10 NOTE — Telephone Encounter (Signed)
OK X1 

## 2014-08-10 NOTE — Telephone Encounter (Signed)
Called [facility] spoke with Jenny Reichmann gave md authorization. Updated EPIC...Johny Chess

## 2014-09-13 ENCOUNTER — Other Ambulatory Visit: Payer: Self-pay | Admitting: Internal Medicine

## 2014-09-13 NOTE — Telephone Encounter (Signed)
7.21.14 last office visit

## 2014-09-13 NOTE — Telephone Encounter (Signed)
Lorazepam has been called to [facility] 

## 2014-09-13 NOTE — Telephone Encounter (Signed)
OK X1 

## 2014-09-27 ENCOUNTER — Ambulatory Visit: Payer: Self-pay | Admitting: Internal Medicine

## 2014-10-13 ENCOUNTER — Ambulatory Visit (INDEPENDENT_AMBULATORY_CARE_PROVIDER_SITE_OTHER): Payer: BC Managed Care – PPO | Admitting: Internal Medicine

## 2014-10-13 ENCOUNTER — Other Ambulatory Visit (INDEPENDENT_AMBULATORY_CARE_PROVIDER_SITE_OTHER): Payer: BC Managed Care – PPO

## 2014-10-13 ENCOUNTER — Encounter: Payer: Self-pay | Admitting: Internal Medicine

## 2014-10-13 VITALS — BP 120/88 | HR 85 | Temp 98.1°F | Ht 74.5 in | Wt 182.0 lb

## 2014-10-13 DIAGNOSIS — Z0000 Encounter for general adult medical examination without abnormal findings: Secondary | ICD-10-CM

## 2014-10-13 DIAGNOSIS — E782 Mixed hyperlipidemia: Secondary | ICD-10-CM

## 2014-10-13 DIAGNOSIS — R42 Dizziness and giddiness: Secondary | ICD-10-CM | POA: Insufficient documentation

## 2014-10-13 MED ORDER — LORAZEPAM 0.5 MG PO TABS: ORAL_TABLET | ORAL | Status: DC

## 2014-10-13 MED ORDER — VITAMIN D 1000 UNITS PO TABS: 1000.0000 [IU] | ORAL_TABLET | Freq: Every day | ORAL | Status: AC

## 2014-10-13 NOTE — Assessment & Plan Note (Signed)
Labs

## 2014-10-13 NOTE — Progress Notes (Signed)
Pre visit review using our clinic review tool, if applicable. No additional management support is needed unless otherwise documented below in the visit note. 

## 2014-10-13 NOTE — Patient Instructions (Signed)
Propecia

## 2014-10-14 LAB — CBC WITH DIFFERENTIAL/PLATELET
BASOS PCT: 1.4 % (ref 0.0–3.0)
Basophils Absolute: 0.1 10*3/uL (ref 0.0–0.1)
EOS PCT: 1.8 % (ref 0.0–5.0)
Eosinophils Absolute: 0.1 10*3/uL (ref 0.0–0.7)
HCT: 46.9 % (ref 39.0–52.0)
Hemoglobin: 15.3 g/dL (ref 13.0–17.0)
LYMPHS PCT: 25.2 % (ref 12.0–46.0)
Lymphs Abs: 1.9 10*3/uL (ref 0.7–4.0)
MCHC: 32.6 g/dL (ref 30.0–36.0)
MCV: 91.6 fl (ref 78.0–100.0)
MONO ABS: 0.4 10*3/uL (ref 0.1–1.0)
Monocytes Relative: 5.5 % (ref 3.0–12.0)
NEUTROS PCT: 66.1 % (ref 43.0–77.0)
Neutro Abs: 5 10*3/uL (ref 1.4–7.7)
Platelets: 284 10*3/uL (ref 150.0–400.0)
RBC: 5.13 Mil/uL (ref 4.22–5.81)
RDW: 14.1 % (ref 11.5–15.5)
WBC: 7.5 10*3/uL (ref 4.0–10.5)

## 2014-10-14 LAB — PSA: PSA: 0.51 ng/mL (ref 0.10–4.00)

## 2014-10-14 LAB — TSH: TSH: 2.02 u[IU]/mL (ref 0.35–4.50)

## 2014-10-15 LAB — HEPATIC FUNCTION PANEL
ALK PHOS: 68 U/L (ref 39–117)
ALT: 29 U/L (ref 0–53)
AST: 21 U/L (ref 0–37)
Albumin: 4.3 g/dL (ref 3.5–5.2)
Bilirubin, Direct: 0.1 mg/dL (ref 0.0–0.3)
TOTAL PROTEIN: 7.2 g/dL (ref 6.0–8.3)
Total Bilirubin: 0.8 mg/dL (ref 0.2–1.2)

## 2014-10-15 LAB — URINALYSIS
BILIRUBIN URINE: NEGATIVE
Hgb urine dipstick: NEGATIVE
Ketones, ur: NEGATIVE
LEUKOCYTES UA: NEGATIVE
Nitrite: NEGATIVE
PH: 8 (ref 5.0–8.0)
SPECIFIC GRAVITY, URINE: 1.015 (ref 1.000–1.030)
Total Protein, Urine: NEGATIVE
Urine Glucose: NEGATIVE
Urobilinogen, UA: 0.2 — AB (ref 0.0–1.0)

## 2014-10-15 LAB — BASIC METABOLIC PANEL
BUN: 17 mg/dL (ref 6–23)
CHLORIDE: 101 meq/L (ref 96–112)
CO2: 27 mEq/L (ref 19–32)
CREATININE: 1 mg/dL (ref 0.4–1.5)
Calcium: 9.6 mg/dL (ref 8.4–10.5)
GFR: 89.43 mL/min (ref >60.00)
Glucose, Bld: 95 mg/dL (ref 70–99)
Potassium: 4.5 mEq/L (ref 3.5–5.1)
SODIUM: 136 meq/L (ref 135–145)

## 2014-10-15 LAB — LIPID PANEL
CHOL/HDL RATIO: 4
Cholesterol: 237 mg/dL — ABNORMAL HIGH (ref 0–200)
HDL: 56.8 mg/dL (ref >39.00)
LDL Cholesterol: 163 mg/dL — ABNORMAL HIGH (ref 0–99)
NONHDL: 180.2
Triglycerides: 88 mg/dL (ref 0.0–149.0)
VLDL: 17.6 mg/dL (ref 0.0–40.0)

## 2014-10-31 NOTE — Progress Notes (Signed)
   Subjective:    Patient ID: [name] [name], male    DOB: 03-01-1966, 48 y.o.   MRN: [id number]  HPI   F/u vertigo and elevated BP  BP Readings from Last 3 Encounters:  10/13/14 120/88  10/19/13 135/65  05/30/13 132/90   Wt Readings from Last 3 Encounters:  10/13/14 182 lb (82.555 kg)  05/30/13 178 lb (80.74 kg)  01/20/13 174 lb (78.926 kg)      Review of Systems  Constitutional: Negative for appetite change, fatigue and unexpected weight change.  HENT: Negative for congestion, nosebleeds, sneezing, sore throat and trouble swallowing.   Eyes: Negative for itching and visual disturbance.  Respiratory: Negative for cough.   Cardiovascular: Negative for chest pain, palpitations and leg swelling.  Gastrointestinal: Negative for nausea, diarrhea, blood in stool and abdominal distention.  Genitourinary: Negative for frequency and hematuria.  Musculoskeletal: Negative for back pain, joint swelling, gait problem and neck pain.  Skin: Negative for rash.  Neurological: Negative for dizziness, tremors, speech difficulty and weakness.  Psychiatric/Behavioral: Negative for sleep disturbance, dysphoric mood and agitation. The patient is not nervous/anxious.        Objective:   Physical Exam  Constitutional: He is oriented to person, place, and time. He appears well-developed. No distress.  NAD  HENT:  Mouth/Throat: Oropharynx is clear and moist.  Eyes: Conjunctivae are normal. Pupils are equal, round, and reactive to light.  Neck: Normal range of motion. No JVD present. No thyromegaly present.  Cardiovascular: Normal rate, regular rhythm, normal heart sounds and intact distal pulses.  Exam reveals no gallop and no friction rub.   No murmur heard. Pulmonary/Chest: Effort normal and breath sounds normal. No respiratory distress. He has no wheezes. He has no rales. He exhibits no tenderness.  Abdominal: Soft. Bowel sounds are normal. He exhibits no distension and no mass. There is no  tenderness. There is no rebound and no guarding.  Musculoskeletal: Normal range of motion. He exhibits no edema or tenderness.  Lymphadenopathy:    He has no cervical adenopathy.  Neurological: He is alert and oriented to person, place, and time. He has normal reflexes. No cranial nerve deficit. He exhibits normal muscle tone. He displays a negative Romberg sign. Coordination and gait normal.  Skin: Skin is warm and dry. No rash noted.  Psychiatric: He has a normal mood and affect. His behavior is normal. Judgment and thought content normal.   Lab Results  Component Value Date   WBC 7.5 10/13/2014   HGB 15.3 10/13/2014   HCT 46.9 10/13/2014   PLT 284.0 10/13/2014   GLUCOSE 95 10/13/2014   CHOL 237* 10/13/2014   TRIG 88.0 10/13/2014   HDL 56.80 10/13/2014   LDLDIRECT 133.4 03/14/2008   LDLCALC 163* 10/13/2014   ALT 29 10/13/2014   AST 21 10/13/2014   NA 136 10/13/2014   K 4.5 10/13/2014   CL 101 10/13/2014   CREATININE 1.0 10/13/2014   BUN 17 10/13/2014   CO2 27 10/13/2014   TSH 2.02 10/13/2014   PSA 0.51 10/13/2014          Assessment & Plan:

## 2014-11-17 ENCOUNTER — Telehealth: Payer: Self-pay | Admitting: *Deleted

## 2014-11-17 ENCOUNTER — Other Ambulatory Visit: Payer: Self-pay | Admitting: *Deleted

## 2014-11-17 MED ORDER — LORAZEPAM 0.5 MG PO TABS: 0.5000 mg | ORAL_TABLET | Freq: Every evening | ORAL | Status: DC | PRN

## 2014-11-17 NOTE — Telephone Encounter (Signed)
Pharmacy calling to verify sig for lorazepam 0.5mg . Is it 1 po q 3rd night or po qhs? Per MD ok to Rf 1 po qhs #30 with 2 refills. Gave verbal. See meds.

## 2015-02-21 ENCOUNTER — Telehealth: Payer: Self-pay | Admitting: *Deleted

## 2015-02-21 NOTE — Telephone Encounter (Signed)
Rf req for Lorazepam 0.5 mg 1 po qhs. Last filled 01/14/15. Ok to Rf?

## 2015-02-22 MED ORDER — LORAZEPAM 0.5 MG PO TABS: 0.5000 mg | ORAL_TABLET | Freq: Every evening | ORAL | Status: DC | PRN

## 2015-02-22 NOTE — Telephone Encounter (Signed)
Done. See meds.  

## 2015-02-22 NOTE — Telephone Encounter (Signed)
OK to fill this prescription with additional refills x2 Sch well exam q 12 mo Thank you!

## 2015-05-21 ENCOUNTER — Other Ambulatory Visit: Payer: Self-pay | Admitting: Internal Medicine

## 2015-08-26 ENCOUNTER — Other Ambulatory Visit: Payer: Self-pay | Admitting: Internal Medicine

## 2015-08-28 NOTE — Telephone Encounter (Signed)
MD out of office pls advise on refill.../lmb 

## 2015-08-28 NOTE — Telephone Encounter (Signed)
Rx fax to [facility].../lmb 

## 2015-10-15 ENCOUNTER — Other Ambulatory Visit: Payer: Self-pay | Admitting: Internal Medicine

## 2015-10-16 ENCOUNTER — Ambulatory Visit (INDEPENDENT_AMBULATORY_CARE_PROVIDER_SITE_OTHER): Payer: BLUE CROSS/BLUE SHIELD | Admitting: Internal Medicine

## 2015-10-16 ENCOUNTER — Encounter: Payer: Self-pay | Admitting: Internal Medicine

## 2015-10-16 ENCOUNTER — Other Ambulatory Visit (INDEPENDENT_AMBULATORY_CARE_PROVIDER_SITE_OTHER): Payer: BLUE CROSS/BLUE SHIELD

## 2015-10-16 VITALS — BP 140/90 | HR 74 | Ht 74.0 in | Wt 178.0 lb

## 2015-10-16 DIAGNOSIS — E782 Mixed hyperlipidemia: Secondary | ICD-10-CM | POA: Diagnosis not present

## 2015-10-16 DIAGNOSIS — R030 Elevated blood-pressure reading, without diagnosis of hypertension: Secondary | ICD-10-CM | POA: Diagnosis not present

## 2015-10-16 DIAGNOSIS — IMO0001 Reserved for inherently not codable concepts without codable children: Secondary | ICD-10-CM

## 2015-10-16 DIAGNOSIS — I10 Essential (primary) hypertension: Secondary | ICD-10-CM | POA: Insufficient documentation

## 2015-10-16 DIAGNOSIS — Z0000 Encounter for general adult medical examination without abnormal findings: Secondary | ICD-10-CM | POA: Insufficient documentation

## 2015-10-16 LAB — HEPATIC FUNCTION PANEL
ALT: 20 U/L (ref 0–53)
AST: 18 U/L (ref 0–37)
Albumin: 4.2 g/dL (ref 3.5–5.2)
Alkaline Phosphatase: 66 U/L (ref 39–117)
Bilirubin, Direct: 0.1 mg/dL (ref 0.0–0.3)
Total Bilirubin: 0.8 mg/dL (ref 0.2–1.2)
Total Protein: 7.4 g/dL (ref 6.0–8.3)

## 2015-10-16 LAB — URINALYSIS
BILIRUBIN URINE: NEGATIVE
HGB URINE DIPSTICK: NEGATIVE
KETONES UR: NEGATIVE
Leukocytes, UA: NEGATIVE
Nitrite: NEGATIVE
PH: 7 (ref 5.0–8.0)
Specific Gravity, Urine: 1.02 (ref 1.000–1.030)
TOTAL PROTEIN, URINE-UPE24: NEGATIVE
URINE GLUCOSE: NEGATIVE
Urobilinogen, UA: 0.2 (ref 0.0–1.0)

## 2015-10-16 LAB — CBC WITH DIFFERENTIAL/PLATELET
BASOS PCT: 0.3 % (ref 0.0–3.0)
Basophils Absolute: 0 10*3/uL (ref 0.0–0.1)
EOS ABS: 0.2 10*3/uL (ref 0.0–0.7)
EOS PCT: 2.8 % (ref 0.0–5.0)
HEMATOCRIT: 46.7 % (ref 39.0–52.0)
HEMOGLOBIN: 15.7 g/dL (ref 13.0–17.0)
LYMPHS PCT: 30.1 % (ref 12.0–46.0)
Lymphs Abs: 1.8 10*3/uL (ref 0.7–4.0)
MCHC: 33.7 g/dL (ref 30.0–36.0)
MCV: 90.9 fl (ref 78.0–100.0)
MONO ABS: 0.5 10*3/uL (ref 0.1–1.0)
Monocytes Relative: 8.3 % (ref 3.0–12.0)
Neutro Abs: 3.6 10*3/uL (ref 1.4–7.7)
Neutrophils Relative %: 58.5 % (ref 43.0–77.0)
Platelets: 285 10*3/uL (ref 150.0–400.0)
RBC: 5.13 Mil/uL (ref 4.22–5.81)
RDW: 12.6 % (ref 11.5–15.5)
WBC: 6.1 10*3/uL (ref 4.0–10.5)

## 2015-10-16 LAB — LIPID PANEL
CHOL/HDL RATIO: 4
CHOLESTEROL: 222 mg/dL — AB (ref 0–200)
HDL: 53.9 mg/dL (ref >39.00)
LDL CALC: 147 mg/dL — AB (ref 0–99)
NonHDL: 168.27
TRIGLYCERIDES: 104 mg/dL (ref 0.0–149.0)
VLDL: 20.8 mg/dL (ref 0.0–40.0)

## 2015-10-16 LAB — TSH: TSH: 1.98 u[IU]/mL (ref 0.35–4.50)

## 2015-10-16 LAB — BASIC METABOLIC PANEL
BUN: 18 mg/dL (ref 6–23)
CHLORIDE: 103 meq/L (ref 96–112)
CO2: 30 mEq/L (ref 19–32)
CREATININE: 0.97 mg/dL (ref 0.40–1.50)
Calcium: 9.3 mg/dL (ref 8.4–10.5)
GFR: 86.95 mL/min (ref >60.00)
Glucose, Bld: 93 mg/dL (ref 70–99)
POTASSIUM: 4.7 meq/L (ref 3.5–5.1)
Sodium: 138 mEq/L (ref 135–145)

## 2015-10-16 MED ORDER — LORAZEPAM 0.5 MG PO TABS: 0.5000 mg | ORAL_TABLET | Freq: Every day | ORAL | Status: DC

## 2015-10-16 NOTE — Patient Instructions (Signed)
Normal BP<130/85 Check blood pressure at home

## 2015-10-16 NOTE — Progress Notes (Signed)
Pre visit review using our clinic review tool, if applicable. No additional management support is needed unless otherwise documented below in the visit note. 

## 2015-10-16 NOTE — Assessment & Plan Note (Signed)
We discussed age appropriate health related issues, including available/recomended screening tests and vaccinations. We discussed a need for adhering to healthy diet and exercise. Labs/EKG were reviewed/ordered. All questions were answered.   

## 2015-10-16 NOTE — Assessment & Plan Note (Signed)
Pt declined statins 

## 2015-10-16 NOTE — Progress Notes (Signed)
Subjective:  Patient ID: [name] [name], male    DOB: 1966-09-25  Age: 49 y.o. MRN: [id number]  CC: Annual Exam   HPI [name] [name] presents for a well exam  Outpatient Prescriptions Prior to Visit  Medication Sig Dispense Refill  . aspirin 81 MG tablet Take 81 mg by mouth daily.      . finasteride (PROSCAR) 5 MG tablet Take 5 mg by mouth daily.    . Multiple Vitamin (MULTIVITAMIN) tablet Take 1 tablet by mouth daily.      . Omega-3 Fatty Acids (FISH OIL PO) Take 2,000 mg by mouth.      Marland Kitchen LORazepam (ATIVAN) 0.5 MG tablet take 1 tablet by mouth at bedtime 30 tablet 0   No facility-administered medications prior to visit.    ROS Review of Systems  Constitutional: Negative for appetite change, fatigue and unexpected weight change.  HENT: Negative for congestion, nosebleeds, sneezing, sore throat and trouble swallowing.   Eyes: Negative for itching and visual disturbance.  Respiratory: Negative for cough.   Cardiovascular: Negative for chest pain, palpitations and leg swelling.  Gastrointestinal: Negative for nausea, diarrhea, blood in stool and abdominal distention.  Genitourinary: Negative for frequency and hematuria.  Musculoskeletal: Negative for back pain, joint swelling, gait problem and neck pain.  Skin: Negative for rash.  Neurological: Negative for dizziness, tremors, speech difficulty and weakness.  Psychiatric/Behavioral: Negative for suicidal ideas, sleep disturbance, dysphoric mood and agitation. The patient is not nervous/anxious.     Objective:  BP 140/90 mmHg  Pulse 74  Ht 6\' 2"  (1.88 m)  Wt 178 lb (80.74 kg)  BMI 22.84 kg/m2  SpO2 98%  BP Readings from Last 3 Encounters:  10/16/15 140/90  10/13/14 120/88  10/19/13 135/65    Wt Readings from Last 3 Encounters:  10/16/15 178 lb (80.74 kg)  10/13/14 182 lb (82.555 kg)  05/30/13 178 lb (80.74 kg)    Physical Exam  Constitutional: He is oriented to person, place, and time. He appears well-developed and  well-nourished. No distress.  HENT:  Head: Normocephalic and atraumatic.  Right Ear: External ear normal.  Left Ear: External ear normal.  Nose: Nose normal.  Mouth/Throat: Oropharynx is clear and moist. No oropharyngeal exudate.  Eyes: Conjunctivae and EOM are normal. Pupils are equal, round, and reactive to light. Right eye exhibits no discharge. Left eye exhibits no discharge. No scleral icterus.  Neck: Normal range of motion. Neck supple. No JVD present. No tracheal deviation present. No thyromegaly present.  Cardiovascular: Normal rate, regular rhythm, normal heart sounds and intact distal pulses.  Exam reveals no gallop and no friction rub.   No murmur heard. Pulmonary/Chest: Effort normal and breath sounds normal. No stridor. No respiratory distress. He has no wheezes. He has no rales. He exhibits no tenderness.  Abdominal: Soft. Bowel sounds are normal. He exhibits no distension and no mass. There is no tenderness. There is no rebound and no guarding.  Genitourinary: Rectum normal, prostate normal and penis normal. Guaiac negative stool. No penile tenderness.  Musculoskeletal: Normal range of motion. He exhibits no edema or tenderness.  Lymphadenopathy:    He has no cervical adenopathy.  Neurological: He is alert and oriented to person, place, and time. He has normal reflexes. No cranial nerve deficit. He exhibits normal muscle tone. Coordination normal.  Skin: Skin is warm and dry. No rash noted. He is not diaphoretic. No erythema. No pallor.  Psychiatric: He has a normal mood and affect. His behavior is  normal. Judgment and thought content normal.    Lab Results  Component Value Date   WBC 6.1 10/16/2015   HGB 15.7 10/16/2015   HCT 46.7 10/16/2015   PLT 285.0 10/16/2015   GLUCOSE 93 10/16/2015   CHOL 222* 10/16/2015   TRIG 104.0 10/16/2015   HDL 53.90 10/16/2015   LDLDIRECT 133.4 03/14/2008   LDLCALC 147* 10/16/2015   ALT 20 10/16/2015   AST 18 10/16/2015   NA 138  10/16/2015   K 4.7 10/16/2015   CL 103 10/16/2015   CREATININE 0.97 10/16/2015   BUN 18 10/16/2015   CO2 30 10/16/2015   TSH 1.98 10/16/2015   PSA 0.51 10/13/2014    Ct Abdomen Pelvis W Contrast  10/19/2013  CLINICAL DATA:  Left lower quadrant pain. History of gastroesophageal reflux disease. EXAM: CT ABDOMEN AND PELVIS WITH CONTRAST TECHNIQUE: Multidetector CT imaging of the abdomen and pelvis was performed using the standard protocol following bolus administration of intravenous contrast. CONTRAST:  175mL OMNIPAQUE IOHEXOL 300 MG/ML  SOLN COMPARISON:  None available for comparison at time of study interpretation. FINDINGS: Included view of the lung bases are clear. The visualized heart and pericardium are unremarkable. Kidneys are orthotopic, demonstrating normal size and morphology. Mildly delayed nephrogram on the left. Mild left hydroureteronephrosis to the level the proximal ureter where a 5 mm calculus is seen. Trace left perinephric stranding without focal fluid collections. Too small to characterize hypodensity in upper pole left kidney, with an adjacent 2 mm calculus. Delayed imaging demonstrates asymmetric, right greater than left contrast enhancement to the proximal urinary collecting system. Urinary bladder is partially distended with very mild circumferential wall thickening. No intravesicular calculi. The liver, spleen, gallbladder, pancreas and adrenal glands are unremarkable scratch data. The stomach, small and large bowel are normal in course and caliber without inflammatory changes, the sensitivity may be decreased by lack of enteric contrast. Normal appendix. No intraperitoneal free fluid nor free air. Great vessels are normal in course and caliber with mild calcific atherosclerosis. No lymphadenopathy by CT size criteria. Internal reproductive organs are unremarkable. The soft tissues and included osseous structures are nonsuspicious. IMPRESSION: Mild left hydroureteronephrosis to  the proximal ureter where a 5 mm calculus is seen. Mildly delayed left renal function. Nonspecific mild left perinephric stranding without focal fluid collection. 2 mm left upper pole renal calculus associated with a too small to characterize hypodensity. Electronically Signed   By: Elon Alas   On: 10/19/2013 01:06    Assessment & Plan:   Athan was seen today for annual exam.  Diagnoses and all orders for this visit:  Well adult exam -     Basic metabolic panel; Future -     CBC with Differential/Platelet; Future -     Lipid panel; Future -     TSH; Future -     Urinalysis; Future -     Hepatic function panel; Future  Elevated BP  Other orders -     LORazepam (ATIVAN) 0.5 MG tablet; Take 1 tablet (0.5 mg total) by mouth at bedtime.   I have changed Mr. Wassmer's LORazepam. I am also having him maintain his aspirin, Omega-3 Fatty Acids (FISH OIL PO), multivitamin, and finasteride.  Meds ordered this encounter  Medications  . LORazepam (ATIVAN) 0.5 MG tablet    Sig: Take 1 tablet (0.5 mg total) by mouth at bedtime.    Dispense:  30 tablet    Refill:  2     Follow-up: Return in about 3  months (around 01/14/2016) for a follow-up visit.  Walker Kehr, MD

## 2015-10-16 NOTE — Assessment & Plan Note (Addendum)
check BP at home Call if elevated - consider ARB

## 2016-01-14 ENCOUNTER — Ambulatory Visit: Payer: Self-pay | Admitting: Internal Medicine

## 2016-02-05 ENCOUNTER — Ambulatory Visit (INDEPENDENT_AMBULATORY_CARE_PROVIDER_SITE_OTHER): Payer: Self-pay | Admitting: Internal Medicine

## 2016-02-05 ENCOUNTER — Encounter: Payer: Self-pay | Admitting: Internal Medicine

## 2016-02-05 VITALS — BP 148/100 | HR 88 | Wt 179.0 lb

## 2016-02-05 DIAGNOSIS — E782 Mixed hyperlipidemia: Secondary | ICD-10-CM

## 2016-02-05 DIAGNOSIS — R030 Elevated blood-pressure reading, without diagnosis of hypertension: Secondary | ICD-10-CM

## 2016-02-05 DIAGNOSIS — IMO0001 Reserved for inherently not codable concepts without codable children: Secondary | ICD-10-CM

## 2016-02-05 MED ORDER — LOSARTAN POTASSIUM 50 MG PO TABS: 50.0000 mg | ORAL_TABLET | Freq: Every day | ORAL | Status: DC

## 2016-02-05 MED ORDER — LORAZEPAM 0.5 MG PO TABS: 0.5000 mg | ORAL_TABLET | Freq: Every day | ORAL | Status: DC

## 2016-02-05 NOTE — Progress Notes (Signed)
Pre visit review using our clinic review tool, if applicable. No additional management support is needed unless otherwise documented below in the visit note. 

## 2016-02-05 NOTE — Assessment & Plan Note (Addendum)
We can try cholestoff Fish oil

## 2016-02-05 NOTE — Assessment & Plan Note (Signed)
BP Readings from Last 3 Encounters:  02/05/16 148/100  10/16/15 140/90  10/13/14 120/88    Start Losartan

## 2016-02-05 NOTE — Progress Notes (Signed)
Subjective:  Patient ID: [name] [name], male    DOB: [birth year]  Age: 50 y.o. MRN: [id number]  CC: No chief complaint on file.   HPI [name] [name] presents for elev BP at home  Outpatient Prescriptions Prior to Visit  Medication Sig Dispense Refill  . aspirin 81 MG tablet Take 81 mg by mouth daily.      . finasteride (PROSCAR) 5 MG tablet Take 5 mg by mouth daily.    . Multiple Vitamin (MULTIVITAMIN) tablet Take 1 tablet by mouth daily.      . Omega-3 Fatty Acids (FISH OIL PO) Take 2,000 mg by mouth.      Marland Kitchen LORazepam (ATIVAN) 0.5 MG tablet Take 1 tablet (0.5 mg total) by mouth at bedtime. 30 tablet 2   No facility-administered medications prior to visit.    ROS Review of Systems  Constitutional: Negative for appetite change, fatigue and unexpected weight change.  HENT: Negative for congestion, nosebleeds, sneezing, sore throat and trouble swallowing.   Eyes: Negative for itching and visual disturbance.  Respiratory: Negative for cough.   Cardiovascular: Negative for chest pain, palpitations and leg swelling.  Gastrointestinal: Negative for nausea, diarrhea, blood in stool and abdominal distention.  Genitourinary: Negative for frequency and hematuria.  Musculoskeletal: Negative for back pain, joint swelling, gait problem and neck pain.  Skin: Negative for rash.  Neurological: Negative for dizziness, tremors, speech difficulty and weakness.  Psychiatric/Behavioral: Negative for sleep disturbance, dysphoric mood and agitation. The patient is not nervous/anxious.     Objective:  BP 148/100 mmHg  Pulse 88  Wt 179 lb (81.194 kg)  SpO2 99%  BP Readings from Last 3 Encounters:  02/05/16 148/100  10/16/15 140/90  10/13/14 120/88    Wt Readings from Last 3 Encounters:  02/05/16 179 lb (81.194 kg)  10/16/15 178 lb (80.74 kg)  10/13/14 182 lb (82.555 kg)    Physical Exam  Constitutional: He is oriented to person, place, and time. He appears well-developed. No distress.    NAD  HENT:  Mouth/Throat: Oropharynx is clear and moist.  Eyes: Conjunctivae are normal. Pupils are equal, round, and reactive to light.  Neck: Normal range of motion. No JVD present. No thyromegaly present.  Cardiovascular: Normal rate, regular rhythm, normal heart sounds and intact distal pulses.  Exam reveals no gallop and no friction rub.   No murmur heard. Pulmonary/Chest: Effort normal and breath sounds normal. No respiratory distress. He has no wheezes. He has no rales. He exhibits no tenderness.  Abdominal: Soft. Bowel sounds are normal. He exhibits no distension and no mass. There is no tenderness. There is no rebound and no guarding.  Musculoskeletal: Normal range of motion. He exhibits no edema or tenderness.  Lymphadenopathy:    He has no cervical adenopathy.  Neurological: He is alert and oriented to person, place, and time. He has normal reflexes. No cranial nerve deficit. He exhibits normal muscle tone. He displays a negative Romberg sign. Coordination and gait normal.  Skin: Skin is warm and dry. No rash noted.  Psychiatric: He has a normal mood and affect. His behavior is normal. Judgment and thought content normal.    Lab Results  Component Value Date   WBC 6.1 10/16/2015   HGB 15.7 10/16/2015   HCT 46.7 10/16/2015   PLT 285.0 10/16/2015   GLUCOSE 93 10/16/2015   CHOL 222* 10/16/2015   TRIG 104.0 10/16/2015   HDL 53.90 10/16/2015   LDLDIRECT 133.4 03/14/2008   LDLCALC 147* 10/16/2015  ALT 20 10/16/2015   AST 18 10/16/2015   NA 138 10/16/2015   K 4.7 10/16/2015   CL 103 10/16/2015   CREATININE 0.97 10/16/2015   BUN 18 10/16/2015   CO2 30 10/16/2015   TSH 1.98 10/16/2015   PSA 0.51 10/13/2014    Ct Abdomen Pelvis W Contrast  10/19/2013  CLINICAL DATA:  Left lower quadrant pain. History of gastroesophageal reflux disease. EXAM: CT ABDOMEN AND PELVIS WITH CONTRAST TECHNIQUE: Multidetector CT imaging of the abdomen and pelvis was performed using the standard  protocol following bolus administration of intravenous contrast. CONTRAST:  160mL OMNIPAQUE IOHEXOL 300 MG/ML  SOLN COMPARISON:  None available for comparison at time of study interpretation. FINDINGS: Included view of the lung bases are clear. The visualized heart and pericardium are unremarkable. Kidneys are orthotopic, demonstrating normal size and morphology. Mildly delayed nephrogram on the left. Mild left hydroureteronephrosis to the level the proximal ureter where a 5 mm calculus is seen. Trace left perinephric stranding without focal fluid collections. Too small to characterize hypodensity in upper pole left kidney, with an adjacent 2 mm calculus. Delayed imaging demonstrates asymmetric, right greater than left contrast enhancement to the proximal urinary collecting system. Urinary bladder is partially distended with very mild circumferential wall thickening. No intravesicular calculi. The liver, spleen, gallbladder, pancreas and adrenal glands are unremarkable scratch data. The stomach, small and large bowel are normal in course and caliber without inflammatory changes, the sensitivity may be decreased by lack of enteric contrast. Normal appendix. No intraperitoneal free fluid nor free air. Great vessels are normal in course and caliber with mild calcific atherosclerosis. No lymphadenopathy by CT size criteria. Internal reproductive organs are unremarkable. The soft tissues and included osseous structures are nonsuspicious. IMPRESSION: Mild left hydroureteronephrosis to the proximal ureter where a 5 mm calculus is seen. Mildly delayed left renal function. Nonspecific mild left perinephric stranding without focal fluid collection. 2 mm left upper pole renal calculus associated with a too small to characterize hypodensity. Electronically Signed   By: Elon Alas   On: 10/19/2013 01:06    Assessment & Plan:   Diagnoses and all orders for this visit:  HYPERLIPIDEMIA  Other orders -     Cancel:  LORazepam (ATIVAN) 0.5 MG tablet; Take 1 tablet (0.5 mg total) by mouth at bedtime. -     losartan (COZAAR) 50 MG tablet; Take 1 tablet (50 mg total) by mouth daily. -     LORazepam (ATIVAN) 0.5 MG tablet; Take 1 tablet (0.5 mg total) by mouth at bedtime.   I am having Mr. Sonnenfeld start on losartan. I am also having him maintain his aspirin, Omega-3 Fatty Acids (FISH OIL PO), multivitamin, finasteride, and LORazepam.  Meds ordered this encounter  Medications  . losartan (COZAAR) 50 MG tablet    Sig: Take 1 tablet (50 mg total) by mouth daily.    Dispense:  30 tablet    Refill:  11  . LORazepam (ATIVAN) 0.5 MG tablet    Sig: Take 1 tablet (0.5 mg total) by mouth at bedtime.    Dispense:  30 tablet    Refill:  2     Follow-up: No Follow-up on file.  Walker Kehr, MD

## 2016-02-05 NOTE — Patient Instructions (Signed)
Normal BP <130/85  Can try Cholestoff for cholesterol

## 2016-06-17 ENCOUNTER — Other Ambulatory Visit: Payer: Self-pay | Admitting: Internal Medicine

## 2016-06-18 NOTE — Telephone Encounter (Signed)
Done

## 2016-08-07 ENCOUNTER — Ambulatory Visit: Payer: Self-pay | Admitting: Internal Medicine

## 2016-08-12 ENCOUNTER — Encounter: Payer: Self-pay | Admitting: Internal Medicine

## 2016-08-12 ENCOUNTER — Ambulatory Visit (INDEPENDENT_AMBULATORY_CARE_PROVIDER_SITE_OTHER): Payer: Self-pay | Admitting: Internal Medicine

## 2016-08-12 ENCOUNTER — Other Ambulatory Visit (INDEPENDENT_AMBULATORY_CARE_PROVIDER_SITE_OTHER): Payer: Self-pay

## 2016-08-12 VITALS — BP 125/85 | HR 71 | Wt 174.0 lb

## 2016-08-12 DIAGNOSIS — E785 Hyperlipidemia, unspecified: Secondary | ICD-10-CM

## 2016-08-12 DIAGNOSIS — R030 Elevated blood-pressure reading, without diagnosis of hypertension: Secondary | ICD-10-CM

## 2016-08-12 DIAGNOSIS — G479 Sleep disorder, unspecified: Secondary | ICD-10-CM

## 2016-08-12 LAB — LIPID PANEL
CHOLESTEROL: 226 mg/dL — AB (ref 0–200)
HDL: 72.5 mg/dL (ref >39.00)
LDL CALC: 139 mg/dL — AB (ref 0–99)
NonHDL: 153.83
TRIGLYCERIDES: 72 mg/dL (ref 0.0–149.0)
Total CHOL/HDL Ratio: 3
VLDL: 14.4 mg/dL (ref 0.0–40.0)

## 2016-08-12 LAB — BASIC METABOLIC PANEL
BUN: 15 mg/dL (ref 6–23)
CO2: 28 mEq/L (ref 19–32)
CREATININE: 0.91 mg/dL (ref 0.40–1.50)
Calcium: 8.9 mg/dL (ref 8.4–10.5)
Chloride: 102 mEq/L (ref 96–112)
GFR: 93.29 mL/min (ref >60.00)
Glucose, Bld: 95 mg/dL (ref 70–99)
Potassium: 4.4 mEq/L (ref 3.5–5.1)
Sodium: 137 mEq/L (ref 135–145)

## 2016-08-12 MED ORDER — LOSARTAN POTASSIUM 50 MG PO TABS: 50.0000 mg | ORAL_TABLET | Freq: Every day | ORAL | 3 refills | Status: DC

## 2016-08-12 MED ORDER — LORAZEPAM 0.5 MG PO TABS: 0.5000 mg | ORAL_TABLET | Freq: Every day | ORAL | 3 refills | Status: DC

## 2016-08-12 NOTE — Progress Notes (Signed)
Subjective:  Patient ID: [name] [name], male    DOB: 1965-11-30  Age: 50 y.o. MRN: [id number]  CC: No chief complaint on file.   HPI [name] [name] presents for elevated BP - taking 1/2 tab Losartan, anxiety   Outpatient Medications Prior to Visit  Medication Sig Dispense Refill  . aspirin 81 MG tablet Take 81 mg by mouth daily.      . finasteride (PROSCAR) 5 MG tablet Take 5 mg by mouth daily.    Marland Kitchen LORazepam (ATIVAN) 0.5 MG tablet take 1 tablet by mouth at bedtime 30 tablet 2  . losartan (COZAAR) 50 MG tablet Take 1 tablet (50 mg total) by mouth daily. 30 tablet 11  . Multiple Vitamin (MULTIVITAMIN) tablet Take 1 tablet by mouth daily.      . Omega-3 Fatty Acids (FISH OIL PO) Take 2,000 mg by mouth.       No facility-administered medications prior to visit.     ROS Review of Systems  Constitutional: Negative for appetite change, fatigue and unexpected weight change.  HENT: Negative for congestion, nosebleeds, sneezing, sore throat and trouble swallowing.   Eyes: Negative for itching and visual disturbance.  Respiratory: Negative for cough.   Cardiovascular: Negative for chest pain, palpitations and leg swelling.  Gastrointestinal: Negative for abdominal distention, blood in stool, diarrhea and nausea.  Genitourinary: Negative for frequency and hematuria.  Musculoskeletal: Negative for back pain, gait problem, joint swelling and neck pain.  Skin: Negative for rash.  Neurological: Negative for dizziness, tremors, speech difficulty and weakness.  Psychiatric/Behavioral: Negative for agitation, dysphoric mood and sleep disturbance. The patient is not nervous/anxious.     Objective:  BP (!) 140/96   Pulse 71   Wt 174 lb (78.9 kg)   SpO2 98%   BMI 22.34 kg/m   BP Readings from Last 3 Encounters:  08/12/16 (!) 140/96  02/05/16 (!) 148/100  10/16/15 140/90    Wt Readings from Last 3 Encounters:  08/12/16 174 lb (78.9 kg)  02/05/16 179 lb (81.2 kg)  10/16/15 178 lb (80.7  kg)    Physical Exam  Constitutional: He is oriented to person, place, and time. He appears well-developed. No distress.  NAD  HENT:  Mouth/Throat: Oropharynx is clear and moist.  Eyes: Conjunctivae are normal. Pupils are equal, round, and reactive to light.  Neck: Normal range of motion. No JVD present. No thyromegaly present.  Cardiovascular: Normal rate, regular rhythm, normal heart sounds and intact distal pulses.  Exam reveals no gallop and no friction rub.   No murmur heard. Pulmonary/Chest: Effort normal and breath sounds normal. No respiratory distress. He has no wheezes. He has no rales. He exhibits no tenderness.  Abdominal: Soft. Bowel sounds are normal. He exhibits no distension and no mass. There is no tenderness. There is no rebound and no guarding.  Musculoskeletal: Normal range of motion. He exhibits no edema or tenderness.  Lymphadenopathy:    He has no cervical adenopathy.  Neurological: He is alert and oriented to person, place, and time. He has normal reflexes. No cranial nerve deficit. He exhibits normal muscle tone. He displays a negative Romberg sign. Coordination and gait normal.  Skin: Skin is warm and dry. No rash noted.  Psychiatric: He has a normal mood and affect. His behavior is normal. Judgment and thought content normal.    Lab Results  Component Value Date   WBC 6.1 10/16/2015   HGB 15.7 10/16/2015   HCT 46.7 10/16/2015   PLT 285.0  10/16/2015   GLUCOSE 93 10/16/2015   CHOL 222 (H) 10/16/2015   TRIG 104.0 10/16/2015   HDL 53.90 10/16/2015   LDLDIRECT 133.4 03/14/2008   LDLCALC 147 (H) 10/16/2015   ALT 20 10/16/2015   AST 18 10/16/2015   NA 138 10/16/2015   K 4.7 10/16/2015   CL 103 10/16/2015   CREATININE 0.97 10/16/2015   BUN 18 10/16/2015   CO2 30 10/16/2015   TSH 1.98 10/16/2015   PSA 0.51 10/13/2014    Ct Abdomen Pelvis W Contrast  Result Date: 10/19/2013 CLINICAL DATA:  Left lower quadrant pain. History of gastroesophageal reflux  disease. EXAM: CT ABDOMEN AND PELVIS WITH CONTRAST TECHNIQUE: Multidetector CT imaging of the abdomen and pelvis was performed using the standard protocol following bolus administration of intravenous contrast. CONTRAST:  1104mL OMNIPAQUE IOHEXOL 300 MG/ML  SOLN COMPARISON:  None available for comparison at time of study interpretation. FINDINGS: Included view of the lung bases are clear. The visualized heart and pericardium are unremarkable. Kidneys are orthotopic, demonstrating normal size and morphology. Mildly delayed nephrogram on the left. Mild left hydroureteronephrosis to the level the proximal ureter where a 5 mm calculus is seen. Trace left perinephric stranding without focal fluid collections. Too small to characterize hypodensity in upper pole left kidney, with an adjacent 2 mm calculus. Delayed imaging demonstrates asymmetric, right greater than left contrast enhancement to the proximal urinary collecting system. Urinary bladder is partially distended with very mild circumferential wall thickening. No intravesicular calculi. The liver, spleen, gallbladder, pancreas and adrenal glands are unremarkable scratch data. The stomach, small and large bowel are normal in course and caliber without inflammatory changes, the sensitivity may be decreased by lack of enteric contrast. Normal appendix. No intraperitoneal free fluid nor free air. Great vessels are normal in course and caliber with mild calcific atherosclerosis. No lymphadenopathy by CT size criteria. Internal reproductive organs are unremarkable. The soft tissues and included osseous structures are nonsuspicious. IMPRESSION: Mild left hydroureteronephrosis to the proximal ureter where a 5 mm calculus is seen. Mildly delayed left renal function. Nonspecific mild left perinephric stranding without focal fluid collection. 2 mm left upper pole renal calculus associated with a too small to characterize hypodensity. Electronically Signed   By: Elon Alas   On: 10/19/2013 01:06    Assessment & Plan:   There are no diagnoses linked to this encounter. I am having Mr. Sassaman maintain his aspirin, Omega-3 Fatty Acids (FISH OIL PO), multivitamin, finasteride, losartan, and LORazepam.  No orders of the defined types were placed in this encounter.    Follow-up: No Follow-up on file.  Walker Kehr, MD

## 2016-08-12 NOTE — Progress Notes (Signed)
Pre visit review using our clinic review tool, if applicable. No additional management support is needed unless otherwise documented below in the visit note. 

## 2016-08-12 NOTE — Assessment & Plan Note (Signed)
Losartan qd Labs

## 2016-08-12 NOTE — Assessment & Plan Note (Signed)
Lorazepam prn 

## 2016-12-25 ENCOUNTER — Other Ambulatory Visit: Payer: Self-pay | Admitting: Internal Medicine

## 2016-12-25 NOTE — Telephone Encounter (Signed)
Called refill into [facility] spoke w/Amy gave MD approval.../lmb

## 2017-02-10 ENCOUNTER — Ambulatory Visit (INDEPENDENT_AMBULATORY_CARE_PROVIDER_SITE_OTHER): Payer: Self-pay | Admitting: Internal Medicine

## 2017-02-10 ENCOUNTER — Encounter: Payer: Self-pay | Admitting: Internal Medicine

## 2017-02-10 ENCOUNTER — Other Ambulatory Visit (INDEPENDENT_AMBULATORY_CARE_PROVIDER_SITE_OTHER): Payer: Self-pay

## 2017-02-10 DIAGNOSIS — Z0000 Encounter for general adult medical examination without abnormal findings: Secondary | ICD-10-CM

## 2017-02-10 LAB — PSA: PSA: 0.19 ng/mL (ref 0.10–4.00)

## 2017-02-10 MED ORDER — LOSARTAN POTASSIUM 100 MG PO TABS: 100.0000 mg | ORAL_TABLET | Freq: Every day | ORAL | 3 refills | Status: DC

## 2017-02-10 MED ORDER — LORAZEPAM 0.5 MG PO TABS: 0.5000 mg | ORAL_TABLET | Freq: Every day | ORAL | 2 refills | Status: DC

## 2017-02-10 NOTE — Assessment & Plan Note (Addendum)
We discussed age appropriate health related issues, including available/recomended screening tests and vaccinations. We discussed a need for adhering to healthy diet and exercise. Labs/EKG were reviewed/ordered. All questions were answered. Colonosc vs cologuard discussed

## 2017-02-10 NOTE — Progress Notes (Signed)
Subjective:  Patient ID: [name] [name], male    DOB: [birth year]  Age: 51 y.o. MRN: [id number]  CC: Follow-up   HPI [name] [name] presents for a well exam f/u HTN, anxiety f/u  Outpatient Medications Prior to Visit  Medication Sig Dispense Refill  . aspirin 81 MG tablet Take 81 mg by mouth daily.      . finasteride (PROSCAR) 5 MG tablet Take 5 mg by mouth daily.    Marland Kitchen LORazepam (ATIVAN) 0.5 MG tablet take 1 tablet by mouth at bedtime 30 tablet 2  . losartan (COZAAR) 50 MG tablet Take 1 tablet (50 mg total) by mouth daily. 90 tablet 3  . Multiple Vitamin (MULTIVITAMIN) tablet Take 1 tablet by mouth daily.      . Omega-3 Fatty Acids (FISH OIL PO) Take 2,000 mg by mouth.       No facility-administered medications prior to visit.     ROS Review of Systems  Constitutional: Negative for appetite change, fatigue and unexpected weight change.  HENT: Negative for congestion, nosebleeds, sneezing, sore throat and trouble swallowing.   Eyes: Negative for itching and visual disturbance.  Respiratory: Negative for cough.   Cardiovascular: Negative for chest pain, palpitations and leg swelling.  Gastrointestinal: Negative for abdominal distention, blood in stool, diarrhea and nausea.  Genitourinary: Negative for frequency and hematuria.  Musculoskeletal: Negative for back pain, gait problem, joint swelling and neck pain.  Skin: Negative for rash.  Neurological: Negative for dizziness, tremors, speech difficulty and weakness.  Psychiatric/Behavioral: Negative for agitation, dysphoric mood and sleep disturbance. The patient is not nervous/anxious.     Objective:  BP (!) 142/92   Pulse 76   Temp 97.1 F (36.2 C)   Ht 6\' 1"  (1.854 m)   Wt 180 lb (81.6 kg)   SpO2 99%   BMI 23.75 kg/m   BP Readings from Last 3 Encounters:  02/10/17 (!) 142/92  08/12/16 125/85  02/05/16 (!) 148/100    Wt Readings from Last 3 Encounters:  02/10/17 180 lb (81.6 kg)  08/12/16 174 lb (78.9 kg)    02/05/16 179 lb (81.2 kg)    Physical Exam  Constitutional: He is oriented to person, place, and time. He appears well-developed. No distress.  NAD  HENT:  Mouth/Throat: Oropharynx is clear and moist.  Eyes: Conjunctivae are normal. Pupils are equal, round, and reactive to light.  Neck: Normal range of motion. No JVD present. No thyromegaly present.  Cardiovascular: Normal rate, regular rhythm, normal heart sounds and intact distal pulses.  Exam reveals no gallop and no friction rub.   No murmur heard. Pulmonary/Chest: Effort normal and breath sounds normal. No respiratory distress. He has no wheezes. He has no rales. He exhibits no tenderness.  Abdominal: Soft. Bowel sounds are normal. He exhibits no distension and no mass. There is no tenderness. There is no rebound and no guarding.  Genitourinary: Rectum normal and prostate normal. Rectal exam shows guaiac negative stool.  Musculoskeletal: Normal range of motion. He exhibits no edema or tenderness.  Lymphadenopathy:    He has no cervical adenopathy.  Neurological: He is alert and oriented to person, place, and time. He has normal reflexes. No cranial nerve deficit. He exhibits normal muscle tone. He displays a negative Romberg sign. Coordination and gait normal.  Skin: Skin is warm and dry. No rash noted.  Psychiatric: He has a normal mood and affect. His behavior is normal. Judgment and thought content normal.    Lab Results  Component Value Date   WBC 6.1 10/16/2015   HGB 15.7 10/16/2015   HCT 46.7 10/16/2015   PLT 285.0 10/16/2015   GLUCOSE 95 08/12/2016   CHOL 226 (H) 08/12/2016   TRIG 72.0 08/12/2016   HDL 72.50 08/12/2016   LDLDIRECT 133.4 03/14/2008   LDLCALC 139 (H) 08/12/2016   ALT 20 10/16/2015   AST 18 10/16/2015   NA 137 08/12/2016   K 4.4 08/12/2016   CL 102 08/12/2016   CREATININE 0.91 08/12/2016   BUN 15 08/12/2016   CO2 28 08/12/2016   TSH 1.98 10/16/2015   PSA 0.51 10/13/2014    Ct Abdomen Pelvis W  Contrast  Result Date: 10/19/2013 CLINICAL DATA:  Left lower quadrant pain. History of gastroesophageal reflux disease. EXAM: CT ABDOMEN AND PELVIS WITH CONTRAST TECHNIQUE: Multidetector CT imaging of the abdomen and pelvis was performed using the standard protocol following bolus administration of intravenous contrast. CONTRAST:  155mL OMNIPAQUE IOHEXOL 300 MG/ML  SOLN COMPARISON:  None available for comparison at time of study interpretation. FINDINGS: Included view of the lung bases are clear. The visualized heart and pericardium are unremarkable. Kidneys are orthotopic, demonstrating normal size and morphology. Mildly delayed nephrogram on the left. Mild left hydroureteronephrosis to the level the proximal ureter where a 5 mm calculus is seen. Trace left perinephric stranding without focal fluid collections. Too small to characterize hypodensity in upper pole left kidney, with an adjacent 2 mm calculus. Delayed imaging demonstrates asymmetric, right greater than left contrast enhancement to the proximal urinary collecting system. Urinary bladder is partially distended with very mild circumferential wall thickening. No intravesicular calculi. The liver, spleen, gallbladder, pancreas and adrenal glands are unremarkable scratch data. The stomach, small and large bowel are normal in course and caliber without inflammatory changes, the sensitivity may be decreased by lack of enteric contrast. Normal appendix. No intraperitoneal free fluid nor free air. Great vessels are normal in course and caliber with mild calcific atherosclerosis. No lymphadenopathy by CT size criteria. Internal reproductive organs are unremarkable. The soft tissues and included osseous structures are nonsuspicious. IMPRESSION: Mild left hydroureteronephrosis to the proximal ureter where a 5 mm calculus is seen. Mildly delayed left renal function. Nonspecific mild left perinephric stranding without focal fluid collection. 2 mm left upper pole  renal calculus associated with a too small to characterize hypodensity. Electronically Signed   By: Elon Alas   On: 10/19/2013 01:06    Assessment & Plan:   There are no diagnoses linked to this encounter. I am having Mr. Esselman maintain his aspirin, Omega-3 Fatty Acids (FISH OIL PO), multivitamin, finasteride, losartan, and LORazepam.  No orders of the defined types were placed in this encounter.    Follow-up: No Follow-up on file.  Walker Kehr, MD

## 2017-06-29 ENCOUNTER — Encounter: Payer: Self-pay | Admitting: Family Medicine

## 2017-06-29 ENCOUNTER — Ambulatory Visit (INDEPENDENT_AMBULATORY_CARE_PROVIDER_SITE_OTHER): Payer: Self-pay | Admitting: Family Medicine

## 2017-06-29 DIAGNOSIS — M5010 Cervical disc disorder with radiculopathy, unspecified cervical region: Secondary | ICD-10-CM

## 2017-06-29 MED ORDER — PREDNISONE 10 MG PO TABS: ORAL_TABLET | ORAL | 0 refills | Status: DC

## 2017-06-29 NOTE — Progress Notes (Signed)
PCP: Plotnikov, Evie Lacks, MD  Subjective:   HPI: Patient is a 51 y.o. male here for left shoulder/neck pain.  Patient reports for a few months now he's had a constant ache from posterior left shoulder down arm to elbow. Some aching into left middle finger. Pain level up to 4-5/10.  No numbness or tingling. He is right handed. Has tried only occasional ibuprofen. No night pain. No prior issues with left shoulder or neck. No bowel/bladder dysfunction.  Past Medical History:  Diagnosis Date  . GERD (gastroesophageal reflux disease)   . Hyperlipemia   . Seborrheic dermatitis     Current Outpatient Prescriptions on File Prior to Visit  Medication Sig Dispense Refill  . aspirin 81 MG tablet Take 81 mg by mouth daily.      . finasteride (PROSCAR) 5 MG tablet Take 5 mg by mouth daily.    Marland Kitchen LORazepam (ATIVAN) 0.5 MG tablet Take 1 tablet (0.5 mg total) by mouth at bedtime. 30 tablet 2  . losartan (COZAAR) 100 MG tablet Take 1 tablet (100 mg total) by mouth daily. 90 tablet 3  . Multiple Vitamin (MULTIVITAMIN) tablet Take 1 tablet by mouth daily.      . Omega-3 Fatty Acids (FISH OIL PO) Take 2,000 mg by mouth.       No current facility-administered medications on file prior to visit.     Past Surgical History:  Procedure Laterality Date  . ORCHIECTOMY  1999   Nonmalignant mass; mildly elevated tumor marker preop  . UPPER GASTROINTESTINAL ENDOSCOPY  2008   esophageal stricture ; Dr Deatra Ina  . VASECTOMY  2008   Dr Amalia Hailey    Allergies  Allergen Reactions  . Statins     fatigue    Social History   Social History  . Marital status: Married    Spouse name: N/A  . Number of children: N/A  . Years of education: N/A   Occupational History  . Not on file.   Social History Main Topics  . Smoking status: Never Smoker  . Smokeless tobacco: Never Used  . Alcohol use 6.0 oz/week    10 Glasses of wine per week     Comment: socially  . Drug use: No  . Sexual activity: Yes    Other Topics Concern  . Not on file   Social History Narrative  . No narrative on file    Family History  Problem Relation Age of Onset  . Hyperlipidemia Mother   . Heart disease Father 36       stent   . Cancer Brother 84       prostate  . Heart failure Maternal Grandfather   . Heart failure Maternal Grandmother   . Diabetes Neg Hx   . Stroke Neg Hx   . Depression Neg Hx   . Alcohol abuse Neg Hx     BP 121/86   Pulse 82   Ht 6\' 1"  (1.854 m)   Wt 180 lb (81.6 kg)   BMI 23.75 kg/m   Review of Systems: See HPI above.     Objective:  Physical Exam:  Gen: NAD, comfortable in exam room  Neck: No gross deformity, swelling, bruising. TTP .  No midline/bony TTP. FROM. BUE strength 5/5.   Sensation intact to light touch.   2+ equal reflexes in biceps, 1+ triceps and brachioradialis tendons. Negative spurlings. NV intact distal BUEs.  Left shoulder: No swelling, ecchymoses.  No gross deformity. No TTP. FROM. Negative Hawkins, Neers. Negative  Yergasons. Strength 5/5 with empty can and resisted internal/external rotation. Negative apprehension. NV intact distally.  Right shoulder: FROM without pain.   Assessment & Plan:  1. Cervical radiculopathy - patient's exam is benign and reassuring.  His level of pain, distribution is consistent with radiculopathy.  Will start with prednisone dose pack, home exercises.  Discussed ergonomic issues.  Consider physical therapy, MRI if not improving.  F/u in 1 month.

## 2017-06-29 NOTE — Patient Instructions (Signed)
You have cervical radiculopathy (a pinched nerve in the neck). Prednisone 6 day dose pack to relieve irritation/inflammation of the nerve. Aleve 2 tabs twice a day with food for pain and inflammation - start day AFTER finishing prednisone. Consider cervical collar if severely painful. Simple range of motion exercises within limits of pain to prevent further stiffness. Consider physical therapy for stretching, exercises, traction, and modalities. Heat 15 minutes at a time 3-4 times a day to help with spasms. Watch head position when on computers, texting, when sleeping in bed - should in line with back to prevent further nerve traction and irritation. Consider home traction unit if you get benefit with this in physical therapy. If not improving we will consider an MRI. Follow up with me in 1 month but call me sooner if you're not improving.

## 2017-06-30 DIAGNOSIS — M5010 Cervical disc disorder with radiculopathy, unspecified cervical region: Secondary | ICD-10-CM | POA: Insufficient documentation

## 2017-06-30 NOTE — Assessment & Plan Note (Signed)
patient's exam is benign and reassuring.  His level of pain, distribution is consistent with radiculopathy.  Will start with prednisone dose pack, home exercises.  Discussed ergonomic issues.  Consider physical therapy, MRI if not improving.  F/u in 1 month.

## 2017-08-12 ENCOUNTER — Encounter: Payer: Self-pay | Admitting: Internal Medicine

## 2017-08-12 ENCOUNTER — Other Ambulatory Visit (INDEPENDENT_AMBULATORY_CARE_PROVIDER_SITE_OTHER): Payer: Self-pay

## 2017-08-12 ENCOUNTER — Ambulatory Visit (INDEPENDENT_AMBULATORY_CARE_PROVIDER_SITE_OTHER): Payer: Self-pay | Admitting: Internal Medicine

## 2017-08-12 VITALS — BP 126/78 | HR 68 | Temp 98.0°F | Ht 73.0 in | Wt 178.0 lb

## 2017-08-12 DIAGNOSIS — G479 Sleep disorder, unspecified: Secondary | ICD-10-CM

## 2017-08-12 DIAGNOSIS — E785 Hyperlipidemia, unspecified: Secondary | ICD-10-CM

## 2017-08-12 DIAGNOSIS — E782 Mixed hyperlipidemia: Secondary | ICD-10-CM

## 2017-08-12 DIAGNOSIS — R0683 Snoring: Secondary | ICD-10-CM

## 2017-08-12 DIAGNOSIS — Z0000 Encounter for general adult medical examination without abnormal findings: Secondary | ICD-10-CM

## 2017-08-12 DIAGNOSIS — R030 Elevated blood-pressure reading, without diagnosis of hypertension: Secondary | ICD-10-CM

## 2017-08-12 LAB — LIPID PANEL
CHOLESTEROL: 217 mg/dL — AB (ref 0–200)
HDL: 54.4 mg/dL (ref >39.00)
LDL Cholesterol: 145 mg/dL — ABNORMAL HIGH (ref 0–99)
NONHDL: 162.33
Total CHOL/HDL Ratio: 4
Triglycerides: 85 mg/dL (ref 0.0–149.0)
VLDL: 17 mg/dL (ref 0.0–40.0)

## 2017-08-12 MED ORDER — LOSARTAN POTASSIUM 100 MG PO TABS: 100.0000 mg | ORAL_TABLET | Freq: Every day | ORAL | 3 refills | Status: DC

## 2017-08-12 MED ORDER — LORAZEPAM 0.5 MG PO TABS: 0.5000 mg | ORAL_TABLET | Freq: Every day | ORAL | 3 refills | Status: DC

## 2017-08-12 NOTE — Assessment & Plan Note (Signed)
Much better BP on Losartan

## 2017-08-12 NOTE — Progress Notes (Signed)
Subjective:  Patient ID: [name] [name], male    DOB: [birth year]  Age: 51 y.o. MRN: [id number]  CC: No chief complaint on file.   HPI [name] [name] presents for anxiety, insomnia f/u F/u elevated lipids  Outpatient Medications Prior to Visit  Medication Sig Dispense Refill  . aspirin 81 MG tablet Take 81 mg by mouth daily.      . finasteride (PROSCAR) 5 MG tablet Take 5 mg by mouth daily.    Marland Kitchen LORazepam (ATIVAN) 0.5 MG tablet Take 1 tablet (0.5 mg total) by mouth at bedtime. 30 tablet 2  . losartan (COZAAR) 100 MG tablet Take 1 tablet (100 mg total) by mouth daily. 90 tablet 3  . Multiple Vitamin (MULTIVITAMIN) tablet Take 1 tablet by mouth daily.      . Omega-3 Fatty Acids (FISH OIL PO) Take 2,000 mg by mouth.      . predniSONE (DELTASONE) 10 MG tablet 6 tabs po day 1, 5 tabs po day 2, 4 tabs po day 3, 3 tabs po day 4, 2 tabs po day 5, 1 tab po day 6 21 tablet 0   No facility-administered medications prior to visit.     ROS Review of Systems  Constitutional: Negative for appetite change, fatigue and unexpected weight change.  HENT: Negative for congestion, nosebleeds, sneezing, sore throat and trouble swallowing.   Eyes: Negative for itching and visual disturbance.  Respiratory: Negative for cough.   Cardiovascular: Negative for chest pain, palpitations and leg swelling.  Gastrointestinal: Negative for abdominal distention, blood in stool, diarrhea and nausea.  Genitourinary: Negative for frequency and hematuria.  Musculoskeletal: Negative for back pain, gait problem, joint swelling and neck pain.  Skin: Negative for rash.  Neurological: Negative for dizziness, tremors, speech difficulty and weakness.  Psychiatric/Behavioral: Negative for agitation, dysphoric mood and sleep disturbance. The patient is not nervous/anxious.     Objective:  BP 126/78 (BP Location: Left Arm, Patient Position: Sitting, Cuff Size: Large)   Pulse 68   Temp 98 F (36.7 C) (Oral)   Ht 6\' 1"  (1.854  m)   Wt 178 lb (80.7 kg)   SpO2 99%   BMI 23.48 kg/m   BP Readings from Last 3 Encounters:  08/12/17 126/78  06/29/17 121/86  02/10/17 (!) 142/92    Wt Readings from Last 3 Encounters:  08/12/17 178 lb (80.7 kg)  06/29/17 180 lb (81.6 kg)  02/10/17 180 lb (81.6 kg)    Physical Exam  Constitutional: He is oriented to person, place, and time. He appears well-developed. No distress.  NAD  HENT:  Mouth/Throat: Oropharynx is clear and moist.  Eyes: Pupils are equal, round, and reactive to light. Conjunctivae are normal.  Neck: Normal range of motion. No JVD present. No thyromegaly present.  Cardiovascular: Normal rate, regular rhythm, normal heart sounds and intact distal pulses.  Exam reveals no gallop and no friction rub.   No murmur heard. Pulmonary/Chest: Effort normal and breath sounds normal. No respiratory distress. He has no wheezes. He has no rales. He exhibits no tenderness.  Abdominal: Soft. Bowel sounds are normal. He exhibits no distension and no mass. There is no tenderness. There is no rebound and no guarding.  Musculoskeletal: Normal range of motion. He exhibits no edema or tenderness.  Lymphadenopathy:    He has no cervical adenopathy.  Neurological: He is alert and oriented to person, place, and time. He has normal reflexes. No cranial nerve deficit. He exhibits normal muscle tone. He displays a  negative Romberg sign. Coordination and gait normal.  Skin: Skin is warm and dry. No rash noted.  Psychiatric: He has a normal mood and affect. His behavior is normal. Judgment and thought content normal.    Lab Results  Component Value Date   WBC 6.1 10/16/2015   HGB 15.7 10/16/2015   HCT 46.7 10/16/2015   PLT 285.0 10/16/2015   GLUCOSE 95 08/12/2016   CHOL 226 (H) 08/12/2016   TRIG 72.0 08/12/2016   HDL 72.50 08/12/2016   LDLDIRECT 133.4 03/14/2008   LDLCALC 139 (H) 08/12/2016   ALT 20 10/16/2015   AST 18 10/16/2015   NA 137 08/12/2016   K 4.4 08/12/2016    CL 102 08/12/2016   CREATININE 0.91 08/12/2016   BUN 15 08/12/2016   CO2 28 08/12/2016   TSH 1.98 10/16/2015   PSA 0.19 02/10/2017    Ct Abdomen Pelvis W Contrast  Result Date: 10/19/2013 CLINICAL DATA:  Left lower quadrant pain. History of gastroesophageal reflux disease. EXAM: CT ABDOMEN AND PELVIS WITH CONTRAST TECHNIQUE: Multidetector CT imaging of the abdomen and pelvis was performed using the standard protocol following bolus administration of intravenous contrast. CONTRAST:  134mL OMNIPAQUE IOHEXOL 300 MG/ML  SOLN COMPARISON:  None available for comparison at time of study interpretation. FINDINGS: Included view of the lung bases are clear. The visualized heart and pericardium are unremarkable. Kidneys are orthotopic, demonstrating normal size and morphology. Mildly delayed nephrogram on the left. Mild left hydroureteronephrosis to the level the proximal ureter where a 5 mm calculus is seen. Trace left perinephric stranding without focal fluid collections. Too small to characterize hypodensity in upper pole left kidney, with an adjacent 2 mm calculus. Delayed imaging demonstrates asymmetric, right greater than left contrast enhancement to the proximal urinary collecting system. Urinary bladder is partially distended with very mild circumferential wall thickening. No intravesicular calculi. The liver, spleen, gallbladder, pancreas and adrenal glands are unremarkable scratch data. The stomach, small and large bowel are normal in course and caliber without inflammatory changes, the sensitivity may be decreased by lack of enteric contrast. Normal appendix. No intraperitoneal free fluid nor free air. Great vessels are normal in course and caliber with mild calcific atherosclerosis. No lymphadenopathy by CT size criteria. Internal reproductive organs are unremarkable. The soft tissues and included osseous structures are nonsuspicious. IMPRESSION: Mild left hydroureteronephrosis to the proximal ureter  where a 5 mm calculus is seen. Mildly delayed left renal function. Nonspecific mild left perinephric stranding without focal fluid collection. 2 mm left upper pole renal calculus associated with a too small to characterize hypodensity. Electronically Signed   By: Elon Alas   On: 10/19/2013 01:06    Assessment & Plan:   There are no diagnoses linked to this encounter. I have discontinued Mr. Dicarlo's predniSONE. I am also having him maintain his aspirin, Omega-3 Fatty Acids (FISH OIL PO), multivitamin, finasteride, losartan, and LORazepam.  No orders of the defined types were placed in this encounter.    Follow-up: No Follow-up on file.  Walker Kehr, MD

## 2017-08-12 NOTE — Patient Instructions (Signed)
Contour pillow  

## 2017-08-12 NOTE — Assessment & Plan Note (Addendum)
Statins option discussed

## 2017-08-12 NOTE — Assessment & Plan Note (Signed)
Contour pillow Elevate head end of the bed ENT ref if not better

## 2017-08-12 NOTE — Assessment & Plan Note (Signed)
Lorazepam prn  Potential benefits of a long term benzodiazepines  use as well as potential risks  and complications were explained to the patient and were aknowledged.  

## 2017-08-12 NOTE — Assessment & Plan Note (Signed)
Damontae has a high deductible Lipids today He will do a cologuard Declined a flu shot

## 2017-08-13 ENCOUNTER — Encounter: Payer: Self-pay | Admitting: Internal Medicine

## 2017-08-14 ENCOUNTER — Other Ambulatory Visit: Payer: Self-pay | Admitting: Internal Medicine

## 2017-08-14 DIAGNOSIS — E785 Hyperlipidemia, unspecified: Secondary | ICD-10-CM

## 2017-08-14 MED ORDER — PRAVASTATIN SODIUM 20 MG PO TABS: 20.0000 mg | ORAL_TABLET | Freq: Every day | ORAL | 11 refills | Status: DC

## 2017-08-15 ENCOUNTER — Other Ambulatory Visit: Payer: Self-pay | Admitting: Internal Medicine

## 2017-08-18 NOTE — Telephone Encounter (Signed)
LOV: 10/03/20108. Please advise.

## 2018-01-08 ENCOUNTER — Other Ambulatory Visit: Payer: Self-pay | Admitting: Internal Medicine

## 2018-01-08 NOTE — Telephone Encounter (Signed)
Routing to dr plotnikov, please advise, thanks 

## 2018-02-10 ENCOUNTER — Other Ambulatory Visit (INDEPENDENT_AMBULATORY_CARE_PROVIDER_SITE_OTHER): Payer: Self-pay

## 2018-02-10 ENCOUNTER — Encounter: Payer: Self-pay | Admitting: Internal Medicine

## 2018-02-10 ENCOUNTER — Ambulatory Visit (INDEPENDENT_AMBULATORY_CARE_PROVIDER_SITE_OTHER): Payer: Self-pay | Admitting: Internal Medicine

## 2018-02-10 VITALS — BP 126/78 | HR 94 | Temp 98.3°F | Ht 73.0 in | Wt 184.0 lb

## 2018-02-10 DIAGNOSIS — F4323 Adjustment disorder with mixed anxiety and depressed mood: Secondary | ICD-10-CM

## 2018-02-10 DIAGNOSIS — I10 Essential (primary) hypertension: Secondary | ICD-10-CM

## 2018-02-10 DIAGNOSIS — D485 Neoplasm of uncertain behavior of skin: Secondary | ICD-10-CM | POA: Insufficient documentation

## 2018-02-10 DIAGNOSIS — E785 Hyperlipidemia, unspecified: Secondary | ICD-10-CM

## 2018-02-10 DIAGNOSIS — Z1211 Encounter for screening for malignant neoplasm of colon: Secondary | ICD-10-CM

## 2018-02-10 DIAGNOSIS — G479 Sleep disorder, unspecified: Secondary | ICD-10-CM

## 2018-02-10 LAB — HEPATIC FUNCTION PANEL
ALBUMIN: 4.1 g/dL (ref 3.5–5.2)
ALT: 32 U/L (ref 0–53)
AST: 21 U/L (ref 0–37)
Alkaline Phosphatase: 62 U/L (ref 39–117)
BILIRUBIN DIRECT: 0.1 mg/dL (ref 0.0–0.3)
TOTAL PROTEIN: 7.6 g/dL (ref 6.0–8.3)
Total Bilirubin: 0.8 mg/dL (ref 0.2–1.2)

## 2018-02-10 LAB — LIPID PANEL
Cholesterol: 200 mg/dL (ref 0–200)
HDL: 63.7 mg/dL (ref >39.00)
LDL CALC: 114 mg/dL — AB (ref 0–99)
NonHDL: 136.67
TRIGLYCERIDES: 114 mg/dL (ref 0.0–149.0)
Total CHOL/HDL Ratio: 3
VLDL: 22.8 mg/dL (ref 0.0–40.0)

## 2018-02-10 MED ORDER — PRAVASTATIN SODIUM 20 MG PO TABS: 20.0000 mg | ORAL_TABLET | Freq: Every day | ORAL | 3 refills | Status: DC

## 2018-02-10 MED ORDER — LORAZEPAM 0.5 MG PO TABS: 0.5000 mg | ORAL_TABLET | Freq: Every day | ORAL | 3 refills | Status: DC

## 2018-02-10 NOTE — Assessment & Plan Note (Signed)
On Pravachol Labs 

## 2018-02-10 NOTE — Assessment & Plan Note (Signed)
Lorazepam prn  Potential benefits of a long term benzodiazepines  use as well as potential risks  and complications were explained to the patient and were aknowledged.  

## 2018-02-10 NOTE — Assessment & Plan Note (Signed)
Derm ref

## 2018-02-10 NOTE — Progress Notes (Signed)
Subjective:  Patient ID: [name] [name], male    DOB: Apr 19, 1966  Age: 52 y.o. MRN: [id number]  CC: No chief complaint on file.   HPI [name] [name] presents for anxiety, HTN, dyslipidemia - on Pravachol now C/o moles Heron would like to have a Cologuard done  Outpatient Medications Prior to Visit  Medication Sig Dispense Refill  . aspirin 81 MG tablet Take 81 mg by mouth daily.      . finasteride (PROSCAR) 5 MG tablet Take 5 mg by mouth daily.    Marland Kitchen losartan (COZAAR) 100 MG tablet Take 1 tablet (100 mg total) by mouth daily. 90 tablet 3  . Multiple Vitamin (MULTIVITAMIN) tablet Take 1 tablet by mouth daily.      . Omega-3 Fatty Acids (FISH OIL PO) Take 2,000 mg by mouth.      Marland Kitchen LORazepam (ATIVAN) 0.5 MG tablet TAKE ONE TABLET AT BEDTIME. 30 tablet 1  . pravastatin (PRAVACHOL) 20 MG tablet Take 1 tablet (20 mg total) by mouth daily. 30 tablet 11   No facility-administered medications prior to visit.     ROS Review of Systems  Constitutional: Negative for appetite change, fatigue and unexpected weight change.  HENT: Negative for congestion, nosebleeds, sneezing, sore throat and trouble swallowing.   Eyes: Negative for itching and visual disturbance.  Respiratory: Negative for cough.   Cardiovascular: Negative for chest pain, palpitations and leg swelling.  Gastrointestinal: Negative for abdominal distention, blood in stool, diarrhea and nausea.  Genitourinary: Negative for frequency and hematuria.  Musculoskeletal: Negative for back pain, gait problem, joint swelling and neck pain.  Skin: Negative for rash.  Neurological: Negative for dizziness, tremors, speech difficulty and weakness.  Psychiatric/Behavioral: Positive for sleep disturbance. Negative for agitation and dysphoric mood. The patient is not nervous/anxious.     Objective:  BP 126/78 (BP Location: Left Arm, Patient Position: Sitting, Cuff Size: Large)   Pulse 94   Temp 98.3 F (36.8 C) (Oral)   Ht 6\' 1"  (1.854 m)    Wt 184 lb (83.5 kg)   SpO2 99%   BMI 24.28 kg/m   BP Readings from Last 3 Encounters:  02/10/18 126/78  08/12/17 126/78  06/29/17 121/86    Wt Readings from Last 3 Encounters:  02/10/18 184 lb (83.5 kg)  08/12/17 178 lb (80.7 kg)  06/29/17 180 lb (81.6 kg)    Physical Exam  Constitutional: He is oriented to person, place, and time. He appears well-developed. No distress.  NAD  HENT:  Mouth/Throat: Oropharynx is clear and moist.  Eyes: Pupils are equal, round, and reactive to light. Conjunctivae are normal.  Neck: Normal range of motion. No JVD present. No thyromegaly present.  Cardiovascular: Normal rate, regular rhythm, normal heart sounds and intact distal pulses. Exam reveals no gallop and no friction rub.  No murmur heard. Pulmonary/Chest: Effort normal and breath sounds normal. No respiratory distress. He has no wheezes. He has no rales. He exhibits no tenderness.  Abdominal: Soft. Bowel sounds are normal. He exhibits no distension and no mass. There is no tenderness. There is no rebound and no guarding.  Musculoskeletal: Normal range of motion. He exhibits no edema or tenderness.  Lymphadenopathy:    He has no cervical adenopathy.  Neurological: He is alert and oriented to person, place, and time. He has normal reflexes. No cranial nerve deficit. He exhibits normal muscle tone. He displays a negative Romberg sign. Coordination and gait normal.  Skin: Skin is warm and dry. No rash  noted.  Psychiatric: He has a normal mood and affect. His behavior is normal. Judgment and thought content normal.  moles  Lab Results  Component Value Date   WBC 6.1 10/16/2015   HGB 15.7 10/16/2015   HCT 46.7 10/16/2015   PLT 285.0 10/16/2015   GLUCOSE 95 08/12/2016   CHOL 217 (H) 08/12/2017   TRIG 85.0 08/12/2017   HDL 54.40 08/12/2017   LDLDIRECT 133.4 03/14/2008   LDLCALC 145 (H) 08/12/2017   ALT 20 10/16/2015   AST 18 10/16/2015   NA 137 08/12/2016   K 4.4 08/12/2016   CL 102  08/12/2016   CREATININE 0.91 08/12/2016   BUN 15 08/12/2016   CO2 28 08/12/2016   TSH 1.98 10/16/2015   PSA 0.19 02/10/2017    Ct Abdomen Pelvis W Contrast  Result Date: 10/19/2013 CLINICAL DATA:  Left lower quadrant pain. History of gastroesophageal reflux disease. EXAM: CT ABDOMEN AND PELVIS WITH CONTRAST TECHNIQUE: Multidetector CT imaging of the abdomen and pelvis was performed using the standard protocol following bolus administration of intravenous contrast. CONTRAST:  173mL OMNIPAQUE IOHEXOL 300 MG/ML  SOLN COMPARISON:  None available for comparison at time of study interpretation. FINDINGS: Included view of the lung bases are clear. The visualized heart and pericardium are unremarkable. Kidneys are orthotopic, demonstrating normal size and morphology. Mildly delayed nephrogram on the left. Mild left hydroureteronephrosis to the level the proximal ureter where a 5 mm calculus is seen. Trace left perinephric stranding without focal fluid collections. Too small to characterize hypodensity in upper pole left kidney, with an adjacent 2 mm calculus. Delayed imaging demonstrates asymmetric, right greater than left contrast enhancement to the proximal urinary collecting system. Urinary bladder is partially distended with very mild circumferential wall thickening. No intravesicular calculi. The liver, spleen, gallbladder, pancreas and adrenal glands are unremarkable scratch data. The stomach, small and large bowel are normal in course and caliber without inflammatory changes, the sensitivity may be decreased by lack of enteric contrast. Normal appendix. No intraperitoneal free fluid nor free air. Great vessels are normal in course and caliber with mild calcific atherosclerosis. No lymphadenopathy by CT size criteria. Internal reproductive organs are unremarkable. The soft tissues and included osseous structures are nonsuspicious. IMPRESSION: Mild left hydroureteronephrosis to the proximal ureter where a 5  mm calculus is seen. Mildly delayed left renal function. Nonspecific mild left perinephric stranding without focal fluid collection. 2 mm left upper pole renal calculus associated with a too small to characterize hypodensity. Electronically Signed   By: Elon Alas   On: 10/19/2013 01:06    Assessment & Plan:   There are no diagnoses linked to this encounter. I have changed Eluzer F. Zervas's LORazepam. I am also having him maintain his aspirin, Omega-3 Fatty Acids (FISH OIL PO), multivitamin, finasteride, losartan, and pravastatin.  Meds ordered this encounter  Medications  . LORazepam (ATIVAN) 0.5 MG tablet    Sig: Take 1 tablet (0.5 mg total) by mouth at bedtime.    Dispense:  30 tablet    Refill:  3  . pravastatin (PRAVACHOL) 20 MG tablet    Sig: Take 1 tablet (20 mg total) by mouth daily.    Dispense:  90 tablet    Refill:  3     Follow-up: No follow-ups on file.  Walker Kehr, MD

## 2018-02-10 NOTE — Addendum Note (Signed)
Addended by: Karren Cobble on: 02/10/2018 11:22 AM   Modules accepted: Orders

## 2018-02-10 NOTE — Assessment & Plan Note (Signed)
Losartan 

## 2018-03-09 ENCOUNTER — Other Ambulatory Visit: Payer: Self-pay | Admitting: Internal Medicine

## 2018-07-13 ENCOUNTER — Other Ambulatory Visit: Payer: Self-pay | Admitting: Internal Medicine

## 2018-07-13 NOTE — Telephone Encounter (Signed)
[place] Controlled Database Checked Last filled: 06/08/18 LOV w/you: 02/10/18 Next appt w/you: 09/09/18

## 2018-08-10 LAB — COLOGUARD: Cologuard: NEGATIVE

## 2018-08-12 ENCOUNTER — Encounter: Payer: Self-pay | Admitting: Internal Medicine

## 2018-08-12 ENCOUNTER — Ambulatory Visit (INDEPENDENT_AMBULATORY_CARE_PROVIDER_SITE_OTHER): Payer: Self-pay | Admitting: Internal Medicine

## 2018-08-12 VITALS — BP 122/74 | HR 75 | Temp 97.5°F | Ht 73.0 in | Wt 178.0 lb

## 2018-08-12 DIAGNOSIS — E785 Hyperlipidemia, unspecified: Secondary | ICD-10-CM

## 2018-08-12 DIAGNOSIS — G479 Sleep disorder, unspecified: Secondary | ICD-10-CM

## 2018-08-12 DIAGNOSIS — I10 Essential (primary) hypertension: Secondary | ICD-10-CM

## 2018-08-12 DIAGNOSIS — R42 Dizziness and giddiness: Secondary | ICD-10-CM

## 2018-08-12 MED ORDER — PRAVASTATIN SODIUM 20 MG PO TABS: 20.0000 mg | ORAL_TABLET | Freq: Every day | ORAL | 3 refills | Status: DC

## 2018-08-12 MED ORDER — FINASTERIDE 5 MG PO TABS: 5.0000 mg | ORAL_TABLET | Freq: Every day | ORAL | 5 refills | Status: DC

## 2018-08-12 MED ORDER — LOSARTAN POTASSIUM 100 MG PO TABS: 100.0000 mg | ORAL_TABLET | Freq: Every day | ORAL | 3 refills | Status: DC

## 2018-08-12 MED ORDER — LORAZEPAM 0.5 MG PO TABS: 0.5000 mg | ORAL_TABLET | Freq: Every day | ORAL | 3 refills | Status: DC

## 2018-08-12 NOTE — Assessment & Plan Note (Signed)
Lorazepam prn  Potential benefits of a long term benzodiazepines  use as well as potential risks  and complications were explained to the patient and were aknowledged.  

## 2018-08-12 NOTE — Patient Instructions (Signed)

## 2018-08-12 NOTE — Progress Notes (Signed)
Subjective:  Patient ID: [name] [name], male    DOB: 21-Jan-1966  Age: 52 y.o. MRN: [id number]  CC: No chief complaint on file.   HPI [name] [name] presents for anxiety C/o occ dizziness  Outpatient Medications Prior to Visit  Medication Sig Dispense Refill  . aspirin 81 MG tablet Take 81 mg by mouth daily.      . finasteride (PROSCAR) 5 MG tablet Take 5 mg by mouth daily.    Marland Kitchen LORazepam (ATIVAN) 0.5 MG tablet TAKE ONE TABLET AT BEDTIME. 30 tablet 3  . losartan (COZAAR) 100 MG tablet TAKE 1 TABLET EACH DAY. 30 tablet 5  . Multiple Vitamin (MULTIVITAMIN) tablet Take 1 tablet by mouth daily.      . Omega-3 Fatty Acids (FISH OIL PO) Take 2,000 mg by mouth.      . pravastatin (PRAVACHOL) 20 MG tablet Take 1 tablet (20 mg total) by mouth daily. 90 tablet 3   No facility-administered medications prior to visit.     ROS: Review of Systems  Constitutional: Negative for appetite change, fatigue and unexpected weight change.  HENT: Negative for congestion, nosebleeds, sneezing, sore throat and trouble swallowing.   Eyes: Negative for itching and visual disturbance.  Respiratory: Negative for cough.   Cardiovascular: Negative for chest pain, palpitations and leg swelling.  Gastrointestinal: Negative for abdominal distention, blood in stool, diarrhea and nausea.  Genitourinary: Negative for frequency and hematuria.  Musculoskeletal: Negative for back pain, gait problem, joint swelling and neck pain.  Skin: Negative for rash.  Neurological: Negative for dizziness, tremors, speech difficulty and weakness.  Psychiatric/Behavioral: Negative for agitation, dysphoric mood, sleep disturbance and suicidal ideas. The patient is not nervous/anxious.     Objective:  BP 122/74 (BP Location: Left Arm, Patient Position: Sitting, Cuff Size: Normal)   Pulse 75   Temp (!) 97.5 F (36.4 C) (Oral)   Ht 6\' 1"  (1.854 m)   Wt 178 lb (80.7 kg)   SpO2 97%   BMI 23.48 kg/m   BP Readings from Last 3  Encounters:  08/12/18 122/74  02/10/18 126/78  08/12/17 126/78    Wt Readings from Last 3 Encounters:  08/12/18 178 lb (80.7 kg)  02/10/18 184 lb (83.5 kg)  08/12/17 178 lb (80.7 kg)    Physical Exam  Constitutional: He is oriented to person, place, and time. He appears well-developed. No distress.  NAD  HENT:  Mouth/Throat: Oropharynx is clear and moist.  Eyes: Pupils are equal, round, and reactive to light. Conjunctivae are normal.  Neck: Normal range of motion. No JVD present. No thyromegaly present.  Cardiovascular: Normal rate, regular rhythm, normal heart sounds and intact distal pulses. Exam reveals no gallop and no friction rub.  No murmur heard. Pulmonary/Chest: Effort normal and breath sounds normal. No respiratory distress. He has no wheezes. He has no rales. He exhibits no tenderness.  Abdominal: Soft. Bowel sounds are normal. He exhibits no distension and no mass. There is no tenderness. There is no rebound and no guarding.  Musculoskeletal: Normal range of motion. He exhibits no edema or tenderness.  Lymphadenopathy:    He has no cervical adenopathy.  Neurological: He is alert and oriented to person, place, and time. He has normal reflexes. No cranial nerve deficit. He exhibits normal muscle tone. He displays a negative Romberg sign. Coordination and gait normal.  Skin: Skin is warm and dry. No rash noted.  Psychiatric: He has a normal mood and affect. His behavior is normal. Judgment and  thought content normal.    Lab Results  Component Value Date   WBC 6.1 10/16/2015   HGB 15.7 10/16/2015   HCT 46.7 10/16/2015   PLT 285.0 10/16/2015   GLUCOSE 95 08/12/2016   CHOL 200 02/10/2018   TRIG 114.0 02/10/2018   HDL 63.70 02/10/2018   LDLDIRECT 133.4 03/14/2008   LDLCALC 114 (H) 02/10/2018   ALT 32 02/10/2018   AST 21 02/10/2018   NA 137 08/12/2016   K 4.4 08/12/2016   CL 102 08/12/2016   CREATININE 0.91 08/12/2016   BUN 15 08/12/2016   CO2 28 08/12/2016    TSH 1.98 10/16/2015   PSA 0.19 02/10/2017    Ct Abdomen Pelvis W Contrast  Result Date: 10/19/2013 CLINICAL DATA:  Left lower quadrant pain. History of gastroesophageal reflux disease. EXAM: CT ABDOMEN AND PELVIS WITH CONTRAST TECHNIQUE: Multidetector CT imaging of the abdomen and pelvis was performed using the standard protocol following bolus administration of intravenous contrast. CONTRAST:  155mL OMNIPAQUE IOHEXOL 300 MG/ML  SOLN COMPARISON:  None available for comparison at time of study interpretation. FINDINGS: Included view of the lung bases are clear. The visualized heart and pericardium are unremarkable. Kidneys are orthotopic, demonstrating normal size and morphology. Mildly delayed nephrogram on the left. Mild left hydroureteronephrosis to the level the proximal ureter where a 5 mm calculus is seen. Trace left perinephric stranding without focal fluid collections. Too small to characterize hypodensity in upper pole left kidney, with an adjacent 2 mm calculus. Delayed imaging demonstrates asymmetric, right greater than left contrast enhancement to the proximal urinary collecting system. Urinary bladder is partially distended with very mild circumferential wall thickening. No intravesicular calculi. The liver, spleen, gallbladder, pancreas and adrenal glands are unremarkable scratch data. The stomach, small and large bowel are normal in course and caliber without inflammatory changes, the sensitivity may be decreased by lack of enteric contrast. Normal appendix. No intraperitoneal free fluid nor free air. Great vessels are normal in course and caliber with mild calcific atherosclerosis. No lymphadenopathy by CT size criteria. Internal reproductive organs are unremarkable. The soft tissues and included osseous structures are nonsuspicious. IMPRESSION: Mild left hydroureteronephrosis to the proximal ureter where a 5 mm calculus is seen. Mildly delayed left renal function. Nonspecific mild left  perinephric stranding without focal fluid collection. 2 mm left upper pole renal calculus associated with a too small to characterize hypodensity. Electronically Signed   By: Elon Alas   On: 10/19/2013 01:06    Assessment & Plan:   There are no diagnoses linked to this encounter.   No orders of the defined types were placed in this encounter.    Follow-up: No follow-ups on file.  Walker Kehr, MD

## 2018-08-12 NOTE — Assessment & Plan Note (Signed)
On Losartan 

## 2018-08-12 NOTE — Assessment & Plan Note (Signed)
ENT ref offered B-D exercise

## 2018-08-12 NOTE — Assessment & Plan Note (Signed)
CT Ca scoring offered Pravachol

## 2018-08-13 ENCOUNTER — Ambulatory Visit: Payer: Self-pay | Admitting: Internal Medicine

## 2018-08-20 ENCOUNTER — Encounter: Payer: Self-pay | Admitting: Internal Medicine

## 2018-08-26 ENCOUNTER — Ambulatory Visit (INDEPENDENT_AMBULATORY_CARE_PROVIDER_SITE_OTHER)
Admission: RE | Admit: 2018-08-26 | Discharge: 2018-08-26 | Disposition: A | Discharge status: Home / Self Care | Payer: Self-pay | Source: Ambulatory Visit | Attending: Internal Medicine | Admitting: Internal Medicine

## 2018-08-26 DIAGNOSIS — E785 Hyperlipidemia, unspecified: Secondary | ICD-10-CM

## 2018-12-16 ENCOUNTER — Other Ambulatory Visit: Payer: Self-pay | Admitting: Internal Medicine

## 2019-02-10 ENCOUNTER — Ambulatory Visit: Payer: Self-pay | Admitting: Internal Medicine

## 2019-02-24 IMAGING — CT CT HEART SCORING
2 series · 16 of 20 positions shown, 18 images · non-contrast
Comparison: None

CLINICAL DATA: Risk stratification

EXAM:
Coronary Calcium Score
TECHNIQUE: The patient was scanned on a Siemens Force scanner. Axial
non-contrast 3 mm slices were carried out through the heart. The
data set was analyzed on a dedicated work station and scored using
the Agatson method.

[Series 2: casc 3.0 i36f 2 bestdiast 66 % · axial · 0.38mm/px · z∈[-273,-153]mm · 8 of 52 slices shown, 10 images]
[im 6/52  vessel]
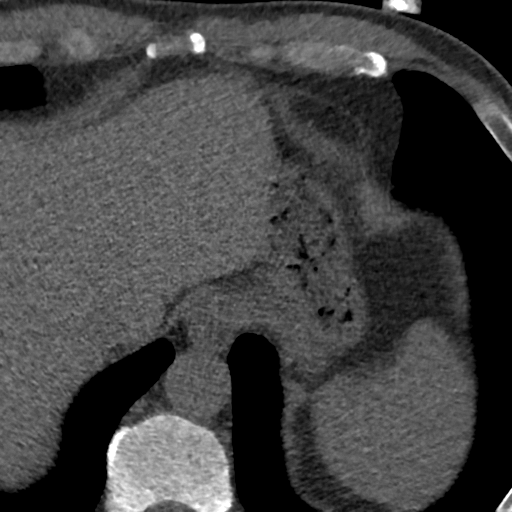
[im 6/52  lung]
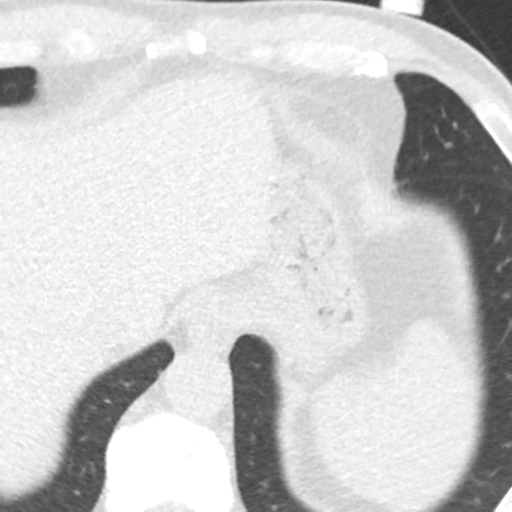
[im 12/52  vessel]
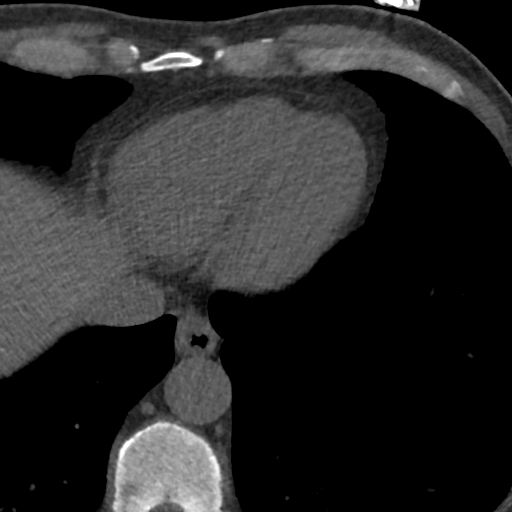
[im 18/52  vessel]
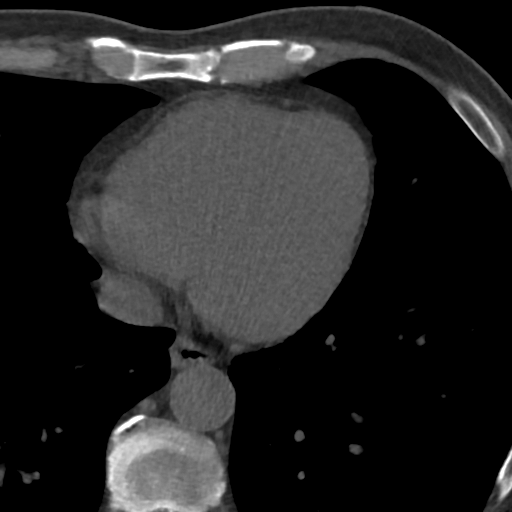
[im 23/52  vessel]
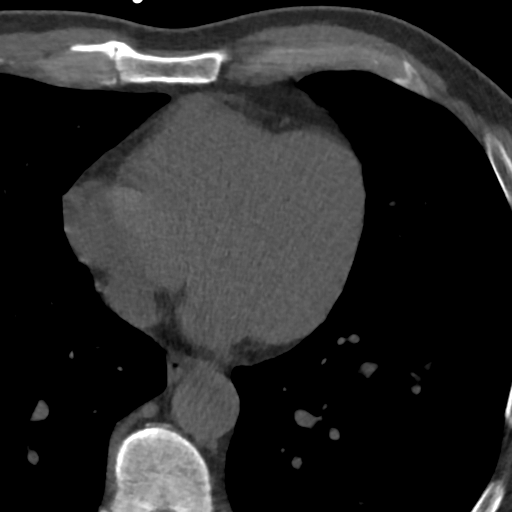
[im 29/52  vessel]
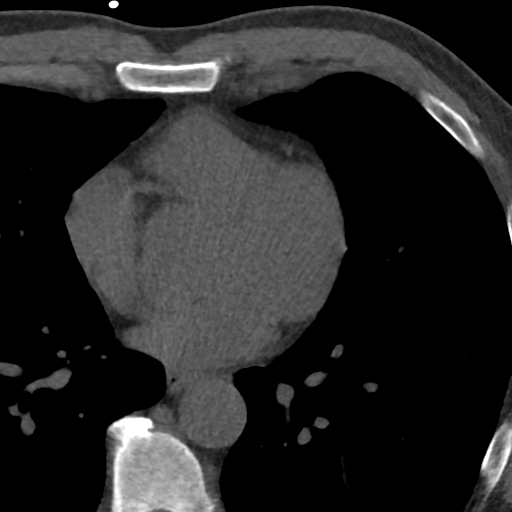
[im 29/52  lung]
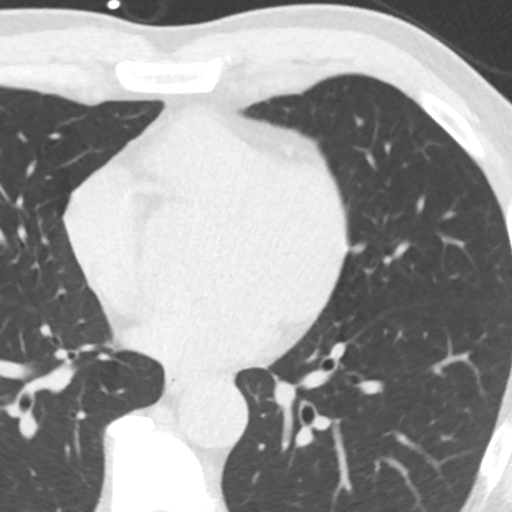
[im 35/52  vessel]
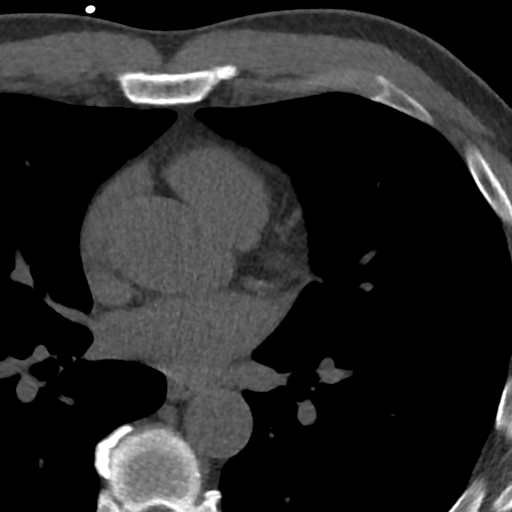
[im 40/52  vessel]
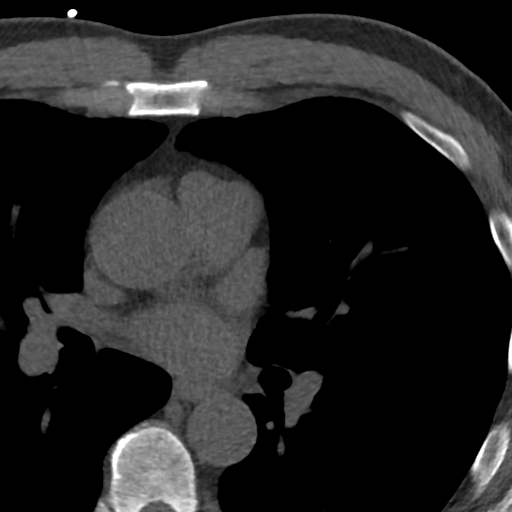
[im 46/52  vessel]
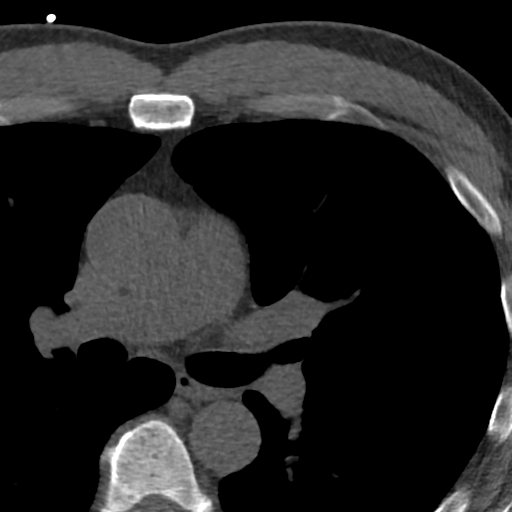

[Series 4: lung st 65 % · axial · 0.68mm/px · z∈[-273,-153]mm · 8 of 52 slices shown]
[im 6/52  lung]
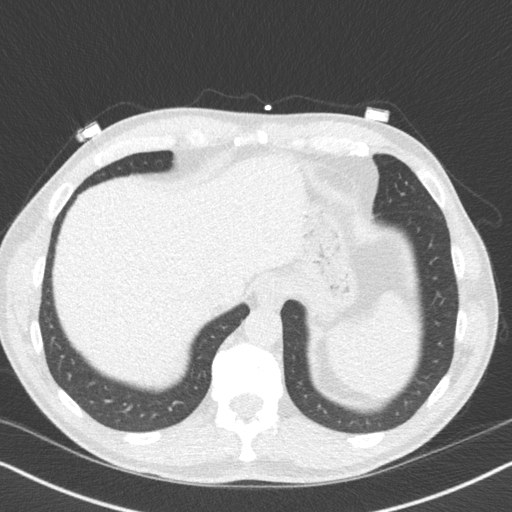
[im 12/52  lung]
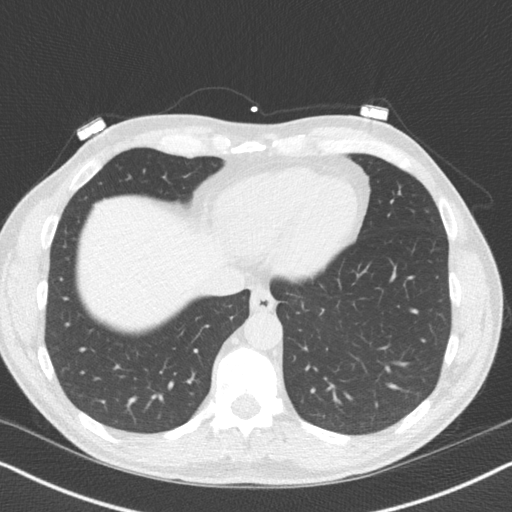
[im 18/52  lung]
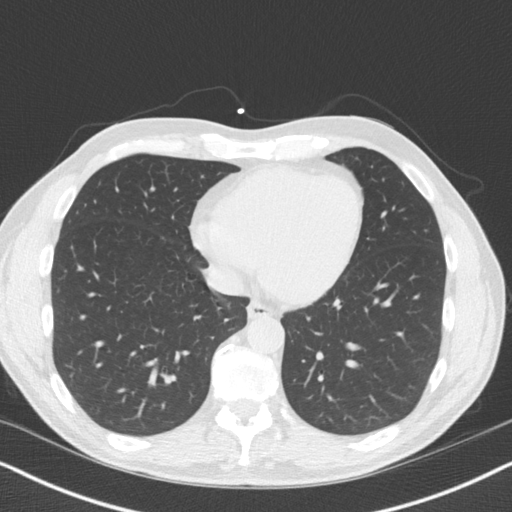
[im 23/52  lung]
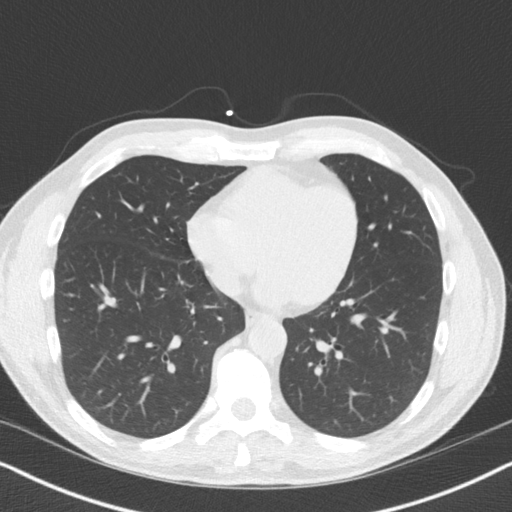
[im 29/52  lung]
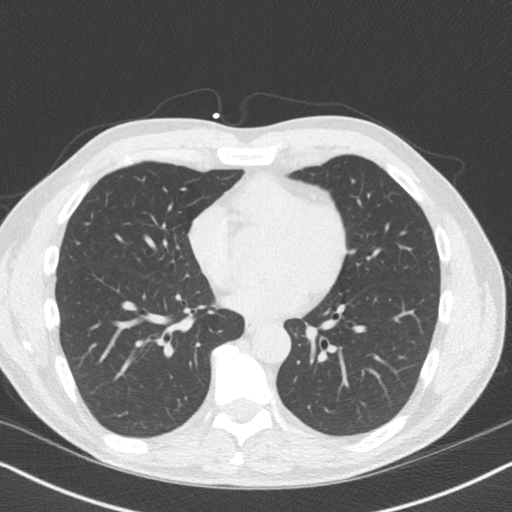
[im 35/52  lung]
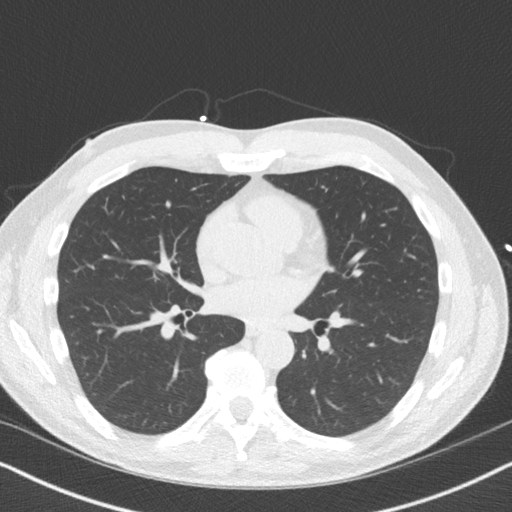
[im 40/52  lung]
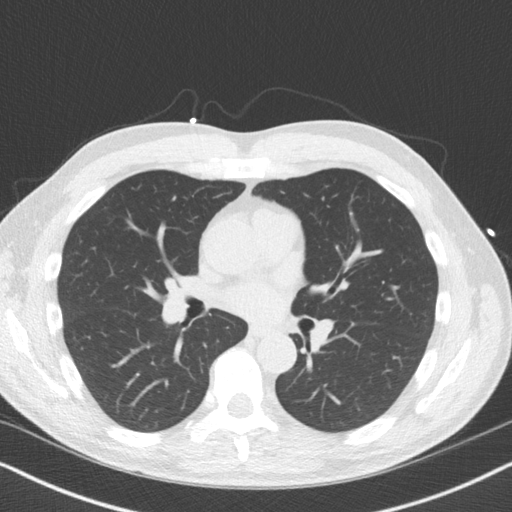
[im 46/52  lung]
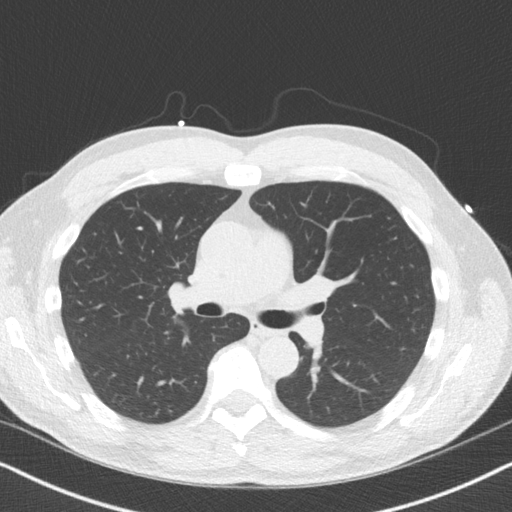

[16 of 20 positions shown; findings below may reference images not displayed]

FINDINGS: Non-cardiac: See separate report from [REDACTED].

Ascending Aorta: Normal size, no calcifications.

Pericardium: Normal.

Coronary arteries: Normal origin.
IMPRESSION: Coronary calcium score of 25. This was 68 percentile for age and sex
matched control.

EXAM:
OVER-READ INTERPRETATION  CT CHEST

The following report is an over-read performed by radiologist Dr.
Alingga Altaqiy [REDACTED] on 08/26/2018. This
over-read does not include interpretation of cardiac or coronary
anatomy or pathology. The coronary calcium score interpretation by
the cardiologist is attached.
FINDINGS: Vascular: Heart is normal size.  Visualized aorta normal caliber.

Mediastinum/Nodes: No adenopathy in the lower mediastinum or hila.

Lungs/Pleura: Visualized lungs clear.  No effusions.

Upper Abdomen: Imaging into the upper abdomen shows no acute
findings.

Musculoskeletal: Chest wall soft tissues are unremarkable. No acute
bony abnormality.
IMPRESSION: No acute or significant extracardiac abnormality.

## 2019-02-25 ENCOUNTER — Other Ambulatory Visit: Payer: Self-pay | Admitting: Internal Medicine

## 2019-02-25 ENCOUNTER — Encounter: Payer: Self-pay | Admitting: Internal Medicine

## 2019-02-25 MED ORDER — PRAVASTATIN SODIUM 20 MG PO TABS: 20.0000 mg | ORAL_TABLET | Freq: Every day | ORAL | 0 refills | Status: DC

## 2019-02-25 NOTE — Telephone Encounter (Signed)
Control database checked last refill: 01/22/2019 LOV: 08/12/2018 NOV:04/19/2019

## 2019-02-25 NOTE — Telephone Encounter (Signed)
Sent refill for Pravastatin pls advise on Lorazepam../lmb

## 2019-04-19 ENCOUNTER — Ambulatory Visit: Payer: Self-pay | Admitting: Internal Medicine

## 2019-05-09 ENCOUNTER — Encounter: Payer: Self-pay | Admitting: Internal Medicine

## 2019-05-09 ENCOUNTER — Other Ambulatory Visit: Payer: Self-pay

## 2019-05-09 ENCOUNTER — Ambulatory Visit (INDEPENDENT_AMBULATORY_CARE_PROVIDER_SITE_OTHER): Payer: Self-pay | Admitting: Internal Medicine

## 2019-05-09 VITALS — BP 124/72 | HR 83 | Temp 97.9°F | Ht 73.0 in | Wt 184.0 lb

## 2019-05-09 DIAGNOSIS — I2583 Coronary atherosclerosis due to lipid rich plaque: Secondary | ICD-10-CM

## 2019-05-09 DIAGNOSIS — I2510 Atherosclerotic heart disease of native coronary artery without angina pectoris: Secondary | ICD-10-CM

## 2019-05-09 DIAGNOSIS — F4323 Adjustment disorder with mixed anxiety and depressed mood: Secondary | ICD-10-CM

## 2019-05-09 DIAGNOSIS — R002 Palpitations: Secondary | ICD-10-CM

## 2019-05-09 DIAGNOSIS — Z0000 Encounter for general adult medical examination without abnormal findings: Secondary | ICD-10-CM

## 2019-05-09 NOTE — Progress Notes (Signed)
Subjective:  Patient ID: [name] [name], male    DOB: 06-08-1966  Age: 53 y.o. MRN: [id number]  CC: No chief complaint on file.   HPI DASON [name] presents for a complaint of occasional palpitations over past several months.  There is a funny sensation in the chest.  No syncope or lightheadedness.  These palpitations are random.  There was no loss of consciousness or chest pain present  Outpatient Medications Prior to Visit  Medication Sig Dispense Refill  . aspirin 81 MG tablet Take 81 mg by mouth daily.      . finasteride (PROSCAR) 5 MG tablet Take 1 tablet (5 mg total) by mouth daily. 30 tablet 5  . LORazepam (ATIVAN) 0.5 MG tablet TAKE ONE TABLET AT BEDTIME. 30 tablet 3  . losartan (COZAAR) 100 MG tablet Take 1 tablet (100 mg total) by mouth daily. 90 tablet 3  . Multiple Vitamin (MULTIVITAMIN) tablet Take 1 tablet by mouth daily.      . Omega-3 Fatty Acids (FISH OIL PO) Take 2,000 mg by mouth.      . pravastatin (PRAVACHOL) 20 MG tablet Take 1 tablet (20 mg total) by mouth daily. Keep follow-up appt in June for future refills 90 tablet 0   No facility-administered medications prior to visit.     ROS: Review of Systems  Constitutional: Negative for appetite change, fatigue and unexpected weight change.  HENT: Negative for congestion, nosebleeds, sneezing, sore throat and trouble swallowing.   Eyes: Negative for itching and visual disturbance.  Respiratory: Negative for cough.   Cardiovascular: Negative for chest pain, palpitations and leg swelling.  Gastrointestinal: Negative for abdominal distention, blood in stool, diarrhea and nausea.  Genitourinary: Negative for frequency and hematuria.  Musculoskeletal: Negative for back pain, gait problem, joint swelling and neck pain.  Skin: Negative for rash.  Neurological: Negative for dizziness, tremors, speech difficulty and weakness.  Psychiatric/Behavioral: Negative for agitation, dysphoric mood, sleep disturbance and suicidal  ideas. The patient is not nervous/anxious.     Objective:  BP 124/72 (BP Location: Left Arm, Patient Position: Sitting, Cuff Size: Normal)   Pulse 83   Temp 97.9 F (36.6 C) (Oral)   Ht 6\' 1"  (1.854 m)   Wt 184 lb (83.5 kg)   SpO2 97%   BMI 24.28 kg/m   BP Readings from Last 3 Encounters:  05/09/19 124/72  08/12/18 122/74  02/10/18 126/78    Wt Readings from Last 3 Encounters:  05/09/19 184 lb (83.5 kg)  08/12/18 178 lb (80.7 kg)  02/10/18 184 lb (83.5 kg)    Physical Exam Constitutional:      General: He is not in acute distress.    Appearance: He is well-developed.     Comments: NAD  Eyes:     Conjunctiva/sclera: Conjunctivae normal.     Pupils: Pupils are equal, round, and reactive to light.  Neck:     Musculoskeletal: Normal range of motion.     Thyroid: No thyromegaly.     Vascular: No JVD.  Cardiovascular:     Rate and Rhythm: Normal rate and regular rhythm.     Heart sounds: Normal heart sounds. No murmur. No friction rub. No gallop.   Pulmonary:     Effort: Pulmonary effort is normal. No respiratory distress.     Breath sounds: Normal breath sounds. No wheezing or rales.  Chest:     Chest wall: No tenderness.  Abdominal:     General: Bowel sounds are normal. There is no  distension.     Palpations: Abdomen is soft. There is no mass.     Tenderness: There is no abdominal tenderness. There is no guarding or rebound.  Musculoskeletal: Normal range of motion.        General: No tenderness.  Lymphadenopathy:     Cervical: No cervical adenopathy.  Skin:    General: Skin is warm and dry.     Findings: No rash.  Neurological:     Mental Status: He is alert and oriented to person, place, and time.     Cranial Nerves: No cranial nerve deficit.     Motor: No abnormal muscle tone.     Coordination: Coordination normal.     Gait: Gait normal.     Deep Tendon Reflexes: Reflexes are normal and symmetric.  Psychiatric:        Behavior: Behavior normal.         Thought Content: Thought content normal.        Judgment: Judgment normal.   Procedure: EKG Indication: palpitations Impression: NSR. No  changes compared to prior EKGs.   Lab Results  Component Value Date   WBC 6.1 10/16/2015   HGB 15.7 10/16/2015   HCT 46.7 10/16/2015   PLT 285.0 10/16/2015   GLUCOSE 95 08/12/2016   CHOL 200 02/10/2018   TRIG 114.0 02/10/2018   HDL 63.70 02/10/2018   LDLDIRECT 133.4 03/14/2008   LDLCALC 114 (H) 02/10/2018   ALT 32 02/10/2018   AST 21 02/10/2018   NA 137 08/12/2016   K 4.4 08/12/2016   CL 102 08/12/2016   CREATININE 0.91 08/12/2016   BUN 15 08/12/2016   CO2 28 08/12/2016   TSH 1.98 10/16/2015   PSA 0.19 02/10/2017    Ct Cardiac Scoring  Addendum Date: 08/26/2018   ADDENDUM REPORT: 08/26/2018 17:40 CLINICAL DATA:  Risk stratification EXAM: Coronary Calcium Score TECHNIQUE: The patient was scanned on a [employer] scanner. Axial non-contrast 3 mm slices were carried out through the heart. The data set was analyzed on a dedicated work station and scored using the [place]. FINDINGS: Non-cardiac: See separate report from [facility]. Ascending Aorta: Normal size, no calcifications. Pericardium: Normal. Coronary arteries: Normal origin. IMPRESSION: Coronary calcium score of 25. This was 24 percentile for age and sex matched control. Electronically Signed   By: Ena Dawley   On: 08/26/2018 17:40   Result Date: 08/26/2018 EXAM: OVER-READ INTERPRETATION  CT CHEST The following report is an over-read performed by radiologist Dr. Rolm Baptise of [facility], [place] on 08/26/2018. This over-read does not include interpretation of cardiac or coronary anatomy or pathology. The coronary calcium score interpretation by the cardiologist is attached. COMPARISON:  None FINDINGS: Vascular: Heart is normal size.  Visualized aorta normal caliber. Mediastinum/Nodes: No adenopathy in the lower mediastinum or hila. Lungs/Pleura: Visualized  lungs clear.  No effusions. Upper Abdomen: Imaging into the upper abdomen shows no acute findings. Musculoskeletal: Chest wall soft tissues are unremarkable. No acute bony abnormality. IMPRESSION: No acute or significant extracardiac abnormality. Electronically Signed: By: Rolm Baptise M.D. On: 08/26/2018 16:46    Assessment & Plan:   There are no diagnoses linked to this encounter.   No orders of the defined types were placed in this encounter.    Follow-up: No follow-ups on file.  Walker Kehr, MD

## 2019-05-10 ENCOUNTER — Other Ambulatory Visit (INDEPENDENT_AMBULATORY_CARE_PROVIDER_SITE_OTHER): Payer: Self-pay

## 2019-05-10 DIAGNOSIS — R002 Palpitations: Secondary | ICD-10-CM

## 2019-05-10 DIAGNOSIS — Z0000 Encounter for general adult medical examination without abnormal findings: Secondary | ICD-10-CM

## 2019-05-10 LAB — LIPID PANEL
Cholesterol: 229 mg/dL — ABNORMAL HIGH (ref 0–200)
HDL: 62.3 mg/dL (ref >39.00)
LDL Cholesterol: 140 mg/dL — ABNORMAL HIGH (ref 0–99)
NonHDL: 166.42
Total CHOL/HDL Ratio: 4
Triglycerides: 133 mg/dL (ref 0.0–149.0)
VLDL: 26.6 mg/dL (ref 0.0–40.0)

## 2019-05-10 LAB — HEPATIC FUNCTION PANEL
ALT: 28 U/L (ref 0–53)
AST: 19 U/L (ref 0–37)
Albumin: 4.4 g/dL (ref 3.5–5.2)
Alkaline Phosphatase: 56 U/L (ref 39–117)
Bilirubin, Direct: 0.1 mg/dL (ref 0.0–0.3)
Total Bilirubin: 0.6 mg/dL (ref 0.2–1.2)
Total Protein: 7.2 g/dL (ref 6.0–8.3)

## 2019-05-10 LAB — BASIC METABOLIC PANEL
BUN: 17 mg/dL (ref 6–23)
CO2: 25 mEq/L (ref 19–32)
Calcium: 9.3 mg/dL (ref 8.4–10.5)
Chloride: 105 mEq/L (ref 96–112)
Creatinine, Ser: 0.84 mg/dL (ref 0.40–1.50)
GFR: 95.25 mL/min (ref >60.00)
Glucose, Bld: 87 mg/dL (ref 70–99)
Potassium: 4.1 mEq/L (ref 3.5–5.1)
Sodium: 139 mEq/L (ref 135–145)

## 2019-05-10 LAB — CBC WITH DIFFERENTIAL/PLATELET
Basophils Absolute: 0.1 10*3/uL (ref 0.0–0.1)
Basophils Relative: 0.9 % (ref 0.0–3.0)
Eosinophils Absolute: 0.2 10*3/uL (ref 0.0–0.7)
Eosinophils Relative: 3 % (ref 0.0–5.0)
HCT: 45.2 % (ref 39.0–52.0)
Hemoglobin: 15.1 g/dL (ref 13.0–17.0)
Lymphocytes Relative: 39.2 % (ref 12.0–46.0)
Lymphs Abs: 2.8 10*3/uL (ref 0.7–4.0)
MCHC: 33.5 g/dL (ref 30.0–36.0)
MCV: 93.3 fl (ref 78.0–100.0)
Monocytes Absolute: 0.6 10*3/uL (ref 0.1–1.0)
Monocytes Relative: 8.5 % (ref 3.0–12.0)
Neutro Abs: 3.5 10*3/uL (ref 1.4–7.7)
Neutrophils Relative %: 48.4 % (ref 43.0–77.0)
Platelets: 281 10*3/uL (ref 150.0–400.0)
RBC: 4.84 Mil/uL (ref 4.22–5.81)
RDW: 13.3 % (ref 11.5–15.5)
WBC: 7.2 10*3/uL (ref 4.0–10.5)

## 2019-05-10 LAB — PSA: PSA: 0.2 ng/mL (ref 0.10–4.00)

## 2019-05-10 LAB — TSH: TSH: 2.72 u[IU]/mL (ref 0.35–4.50)

## 2019-05-11 ENCOUNTER — Other Ambulatory Visit: Payer: Self-pay | Admitting: Internal Medicine

## 2019-05-11 DIAGNOSIS — R002 Palpitations: Secondary | ICD-10-CM | POA: Insufficient documentation

## 2019-05-11 DIAGNOSIS — I2510 Atherosclerotic heart disease of native coronary artery without angina pectoris: Secondary | ICD-10-CM | POA: Insufficient documentation

## 2019-05-11 MED ORDER — PRAVASTATIN SODIUM 40 MG PO TABS: 40.0000 mg | ORAL_TABLET | Freq: Every day | ORAL | 3 refills | Status: DC

## 2019-05-11 NOTE — Assessment & Plan Note (Signed)
Coronary calcium score is 25.  On pravastatin

## 2019-05-11 NOTE — Assessment & Plan Note (Addendum)
The EKG is normal. Labs Cardiology referral

## 2019-05-11 NOTE — Assessment & Plan Note (Signed)
Continue with PRN lorazepam

## 2019-07-26 ENCOUNTER — Other Ambulatory Visit: Payer: Self-pay | Admitting: Internal Medicine

## 2019-09-11 ENCOUNTER — Other Ambulatory Visit: Payer: Self-pay | Admitting: Internal Medicine

## 2019-10-04 ENCOUNTER — Other Ambulatory Visit: Payer: Self-pay | Admitting: Internal Medicine

## 2019-10-26 ENCOUNTER — Ambulatory Visit: Payer: Self-pay | Admitting: Cardiology

## 2019-12-05 ENCOUNTER — Other Ambulatory Visit: Payer: Self-pay | Admitting: Internal Medicine

## 2020-01-08 ENCOUNTER — Other Ambulatory Visit: Payer: Self-pay | Admitting: Internal Medicine

## 2020-03-17 ENCOUNTER — Other Ambulatory Visit: Payer: Self-pay | Admitting: Internal Medicine

## 2020-05-22 ENCOUNTER — Other Ambulatory Visit: Payer: Self-pay | Admitting: Internal Medicine

## 2020-05-22 NOTE — Telephone Encounter (Signed)
Last OV 05/09/19 Last refill 03/17/20 No future appt scheduled.

## 2020-06-06 ENCOUNTER — Telehealth: Payer: Self-pay

## 2020-06-06 ENCOUNTER — Other Ambulatory Visit: Payer: Self-pay | Admitting: Internal Medicine

## 2020-06-06 DIAGNOSIS — Z0000 Encounter for general adult medical examination without abnormal findings: Secondary | ICD-10-CM

## 2020-06-06 NOTE — Telephone Encounter (Signed)
New message    The patient is asking for fasting labs and PSA tests to be entered into epic before appt in Aug.

## 2020-06-08 NOTE — Telephone Encounter (Signed)
Okay.  Done.  Thanks 

## 2020-06-08 NOTE — Addendum Note (Signed)
Addended by: Cassandria Anger on: 06/08/2020 04:14 PM   Modules accepted: Orders

## 2020-06-19 ENCOUNTER — Ambulatory Visit (INDEPENDENT_AMBULATORY_CARE_PROVIDER_SITE_OTHER): Payer: Self-pay | Admitting: Internal Medicine

## 2020-06-19 ENCOUNTER — Encounter: Payer: Self-pay | Admitting: Internal Medicine

## 2020-06-19 ENCOUNTER — Other Ambulatory Visit: Payer: Self-pay

## 2020-06-19 VITALS — BP 128/82 | HR 91 | Temp 98.5°F | Ht 73.0 in | Wt 185.2 lb

## 2020-06-19 DIAGNOSIS — E785 Hyperlipidemia, unspecified: Secondary | ICD-10-CM

## 2020-06-19 DIAGNOSIS — I2583 Coronary atherosclerosis due to lipid rich plaque: Secondary | ICD-10-CM

## 2020-06-19 DIAGNOSIS — Z0000 Encounter for general adult medical examination without abnormal findings: Secondary | ICD-10-CM

## 2020-06-19 DIAGNOSIS — I10 Essential (primary) hypertension: Secondary | ICD-10-CM

## 2020-06-19 DIAGNOSIS — N486 Induration penis plastica: Secondary | ICD-10-CM

## 2020-06-19 DIAGNOSIS — Z23 Encounter for immunization: Secondary | ICD-10-CM

## 2020-06-19 MED ORDER — VITAMIN E 600 UNITS PO CAPS: 600.0000 [IU] | ORAL_CAPSULE | Freq: Every day | ORAL | 3 refills | Status: AC

## 2020-06-19 MED ORDER — LOSARTAN POTASSIUM 100 MG PO TABS: ORAL_TABLET | ORAL | 3 refills | Status: DC

## 2020-06-19 MED ORDER — LORAZEPAM 1 MG PO TABS
1.0000 mg | ORAL_TABLET | Freq: Two times a day (BID) | ORAL | 5 refills | Status: DC | PRN
Start: 2020-06-19 — End: 2020-12-28

## 2020-06-19 NOTE — Assessment & Plan Note (Signed)
We discussed age appropriate health related issues, including available/recomended screening tests and vaccinations. We discussed a need for adhering to healthy diet and exercise. Labs/EKG were reviewed/ordered. All questions were answered. 2019 CT IMPRESSION: Coronary calcium score of 25. This was 37 percentile for age and sex matched control. Electronically Signed   By: Ena Dawley   On: 08/26/2018 17:40

## 2020-06-19 NOTE — Assessment & Plan Note (Signed)
We discussed age appropriate health related issues, including available/recomended screening tests and vaccinations. We discussed a need for adhering to healthy diet and exercise. Labs/EKG were reviewed/ordered. All questions were answered. 2019 CT IMPRESSION: Coronary calcium score of 25. This was 104 percentile for age and sex matched control. Electronically Signed   By: Ena Dawley   On: 08/26/2018 17:40

## 2020-06-19 NOTE — Assessment & Plan Note (Signed)
Losartan 

## 2020-06-19 NOTE — Progress Notes (Signed)
Subjective:  Patient ID: [name] [name], male    DOB: December 19, 1965  Age: 54 y.o. MRN: [id number]  CC: Annual Exam (Want to discuss the Lorazepam & Pravastatin)   HPI [name] [name] presents for a well exam  Outpatient Medications Prior to Visit  Medication Sig Dispense Refill   aspirin 81 MG tablet Take 81 mg by mouth daily.       finasteride (PROSCAR) 5 MG tablet TAKE 1 TABLET ONCE DAILY. 30 tablet 3   LORazepam (ATIVAN) 0.5 MG tablet TAKE ONE TABLET AT BEDTIME. 30 tablet 1   losartan (COZAAR) 100 MG tablet TAKE 1 TABLET EACH DAY. 90 tablet 3   Multiple Vitamin (MULTIVITAMIN) tablet Take 1 tablet by mouth daily.       Omega-3 Fatty Acids (FISH OIL PO) Take 2,000 mg by mouth.       pravastatin (PRAVACHOL) 40 MG tablet Take 1 tablet (40 mg total) by mouth daily. 90 tablet 3   No facility-administered medications prior to visit.    ROS: Review of Systems  Constitutional: Negative for appetite change, fatigue and unexpected weight change.  HENT: Negative for congestion, nosebleeds, sneezing, sore throat and trouble swallowing.   Eyes: Negative for itching and visual disturbance.  Respiratory: Negative for cough.   Cardiovascular: Negative for chest pain, palpitations and leg swelling.  Gastrointestinal: Negative for abdominal distention, blood in stool, diarrhea and nausea.  Genitourinary: Negative for frequency and hematuria.  Musculoskeletal: Negative for back pain, gait problem, joint swelling and neck pain.  Skin: Negative for rash.  Neurological: Negative for dizziness, tremors, speech difficulty and weakness.  Psychiatric/Behavioral: Positive for sleep disturbance. Negative for agitation, dysphoric mood and suicidal ideas. The patient is nervous/anxious.     Objective:  BP 128/82 (BP Location: Left Arm)    Pulse 91    Temp 98.5 F (36.9 C) (Oral)    Ht 6\' 1"  (1.854 m)    Wt 185 lb 3.2 oz (84 kg)    SpO2 98%    BMI 24.43 kg/m   BP Readings from Last 3 Encounters:    06/19/20 128/82  05/09/19 124/72  08/12/18 122/74    Wt Readings from Last 3 Encounters:  06/19/20 185 lb 3.2 oz (84 kg)  05/09/19 184 lb (83.5 kg)  08/12/18 178 lb (80.7 kg)    Physical Exam Constitutional:      General: He is not in acute distress.    Appearance: He is well-developed.     Comments: NAD  Eyes:     Conjunctiva/sclera: Conjunctivae normal.     Pupils: Pupils are equal, round, and reactive to light.  Neck:     Thyroid: No thyromegaly.     Vascular: No JVD.  Cardiovascular:     Rate and Rhythm: Normal rate and regular rhythm.     Heart sounds: Normal heart sounds. No murmur heard.  No friction rub. No gallop.   Pulmonary:     Effort: Pulmonary effort is normal. No respiratory distress.     Breath sounds: Normal breath sounds. No wheezing or rales.  Chest:     Chest wall: No tenderness.  Abdominal:     General: Bowel sounds are normal. There is no distension.     Palpations: Abdomen is soft. There is no mass.     Tenderness: There is no abdominal tenderness. There is no guarding or rebound.  Genitourinary:    Penis: Normal.      Testes: Normal.  Musculoskeletal:  General: No tenderness. Normal range of motion.     Cervical back: Normal range of motion.  Lymphadenopathy:     Cervical: No cervical adenopathy.  Skin:    General: Skin is warm and dry.     Findings: No rash.  Neurological:     Mental Status: He is alert and oriented to person, place, and time.     Cranial Nerves: No cranial nerve deficit.     Motor: No abnormal muscle tone.     Coordination: Coordination normal.     Gait: Gait normal.     Deep Tendon Reflexes: Reflexes are normal and symmetric.  Psychiatric:        Behavior: Behavior normal.        Thought Content: Thought content normal.        Judgment: Judgment normal.     Lab Results  Component Value Date   WBC 7.2 05/10/2019   HGB 15.1 05/10/2019   HCT 45.2 05/10/2019   PLT 281.0 05/10/2019   GLUCOSE 87 05/10/2019    CHOL 229 (H) 05/10/2019   TRIG 133.0 05/10/2019   HDL 62.30 05/10/2019   LDLDIRECT 133.4 03/14/2008   LDLCALC 140 (H) 05/10/2019   ALT 28 05/10/2019   AST 19 05/10/2019   NA 139 05/10/2019   K 4.1 05/10/2019   CL 105 05/10/2019   CREATININE 0.84 05/10/2019   BUN 17 05/10/2019   CO2 25 05/10/2019   TSH 2.72 05/10/2019   PSA 0.20 05/10/2019    CT CARDIAC SCORING  Addendum Date: 08/26/2018   ADDENDUM REPORT: 08/26/2018 17:40 CLINICAL DATA:  Risk stratification EXAM: Coronary Calcium Score TECHNIQUE: The patient was scanned on a [employer] scanner. Axial non-contrast 3 mm slices were carried out through the heart. The data set was analyzed on a dedicated work station and scored using the [place]. FINDINGS: Non-cardiac: See separate report from [facility]. Ascending Aorta: Normal size, no calcifications. Pericardium: Normal. Coronary arteries: Normal origin. IMPRESSION: Coronary calcium score of 25. This was 73 percentile for age and sex matched control. Electronically Signed   By: Ena Dawley   On: 08/26/2018 17:40   Result Date: 08/26/2018 EXAM: OVER-READ INTERPRETATION  CT CHEST The following report is an over-read performed by radiologist Dr. Rolm Baptise of [facility], [place] on 08/26/2018. This over-read does not include interpretation of cardiac or coronary anatomy or pathology. The coronary calcium score interpretation by the cardiologist is attached. COMPARISON:  None FINDINGS: Vascular: Heart is normal size.  Visualized aorta normal caliber. Mediastinum/Nodes: No adenopathy in the lower mediastinum or hila. Lungs/Pleura: Visualized lungs clear.  No effusions. Upper Abdomen: Imaging into the upper abdomen shows no acute findings. Musculoskeletal: Chest wall soft tissues are unremarkable. No acute bony abnormality. IMPRESSION: No acute or significant extracardiac abnormality. Electronically Signed: By: Rolm Baptise M.D. On: 08/26/2018 16:46    Assessment  & Plan:    Walker Kehr, MD

## 2020-06-19 NOTE — Assessment & Plan Note (Signed)
Pravastatin  

## 2020-06-20 ENCOUNTER — Other Ambulatory Visit: Payer: Self-pay | Admitting: Internal Medicine

## 2020-06-20 LAB — PSA: PSA: 0.1 ng/mL (ref <=4.0)

## 2020-06-20 LAB — LIPID PANEL
Cholesterol: 255 mg/dL — ABNORMAL HIGH (ref <200)
HDL: 56 mg/dL (ref >=40)
LDL Cholesterol (Calc): 161 mg/dL (calc) — ABNORMAL HIGH
Non-HDL Cholesterol (Calc): 199 mg/dL (calc) — ABNORMAL HIGH (ref <130)
Total CHOL/HDL Ratio: 4.6 (calc) (ref <5.0)
Triglycerides: 224 mg/dL — ABNORMAL HIGH (ref <150)

## 2020-06-20 LAB — COMPLETE METABOLIC PANEL WITH GFR
AG Ratio: 1.4 (calc) (ref 1.0–2.5)
ALT: 29 U/L (ref 9–46)
AST: 22 U/L (ref 10–35)
Albumin: 4.2 g/dL (ref 3.6–5.1)
Alkaline phosphatase (APISO): 68 U/L (ref 35–144)
BUN: 14 mg/dL (ref 7–25)
CO2: 25 mmol/L (ref 20–32)
Calcium: 9.4 mg/dL (ref 8.6–10.3)
Chloride: 100 mmol/L (ref 98–110)
Creat: 0.86 mg/dL (ref 0.70–1.33)
GFR, Est African American: 113 mL/min/{1.73_m2} (ref >=60)
GFR, Est Non African American: 98 mL/min/{1.73_m2} (ref >=60)
Globulin: 2.9 g/dL (calc) (ref 1.9–3.7)
Glucose, Bld: 92 mg/dL (ref 65–99)
Potassium: 4.2 mmol/L (ref 3.5–5.3)
Sodium: 137 mmol/L (ref 135–146)
Total Bilirubin: 0.8 mg/dL (ref 0.2–1.2)
Total Protein: 7.1 g/dL (ref 6.1–8.1)

## 2020-06-20 LAB — CBC WITH DIFFERENTIAL/PLATELET
Absolute Monocytes: 634 cells/uL (ref 200–950)
Basophils Absolute: 32 cells/uL (ref 0–200)
Basophils Relative: 0.5 %
Eosinophils Absolute: 109 cells/uL (ref 15–500)
Eosinophils Relative: 1.7 %
HCT: 45.5 % (ref 38.5–50.0)
Hemoglobin: 15.3 g/dL (ref 13.2–17.1)
Lymphs Abs: 1658 cells/uL (ref 850–3900)
MCH: 31 pg (ref 27.0–33.0)
MCHC: 33.6 g/dL (ref 32.0–36.0)
MCV: 92.3 fL (ref 80.0–100.0)
MPV: 11.1 fL (ref 7.5–12.5)
Monocytes Relative: 9.9 %
Neutro Abs: 3968 cells/uL (ref 1500–7800)
Neutrophils Relative %: 62 %
Platelets: 300 10*3/uL (ref 140–400)
RBC: 4.93 10*6/uL (ref 4.20–5.80)
RDW: 12.6 % (ref 11.0–15.0)
Total Lymphocyte: 25.9 %
WBC: 6.4 10*3/uL (ref 3.8–10.8)

## 2020-06-20 LAB — URINALYSIS
Bilirubin Urine: NEGATIVE
Glucose, UA: NEGATIVE
Hgb urine dipstick: NEGATIVE
Ketones, ur: NEGATIVE
Leukocytes,Ua: NEGATIVE
Nitrite: NEGATIVE
Protein, ur: NEGATIVE
Specific Gravity, Urine: 1.02 (ref 1.001–1.03)
pH: >=8.5 — AB (ref 5.0–8.0)

## 2020-06-20 LAB — TSH: TSH: 1.83 mIU/L (ref 0.40–4.50)

## 2020-06-24 DIAGNOSIS — N486 Induration penis plastica: Secondary | ICD-10-CM | POA: Insufficient documentation

## 2020-06-24 NOTE — Assessment & Plan Note (Signed)
New-reported by patient Vitamin D Urology referral offered

## 2020-09-16 ENCOUNTER — Other Ambulatory Visit: Payer: Self-pay | Admitting: Internal Medicine

## 2020-10-12 ENCOUNTER — Ambulatory Visit: Payer: Self-pay

## 2020-10-13 ENCOUNTER — Ambulatory Visit: Payer: Self-pay | Attending: Internal Medicine

## 2020-10-13 ENCOUNTER — Other Ambulatory Visit: Payer: Self-pay

## 2020-10-13 DIAGNOSIS — Z23 Encounter for immunization: Secondary | ICD-10-CM

## 2020-10-13 NOTE — Progress Notes (Signed)
   Covid-19 Vaccination Clinic  Name:  [name] [name]    MRN: [id number] DOB: 01-25-1966  10/13/2020  [name] [name] was observed post Covid-19 immunization for 15 minutes without incident. He was provided with Vaccine Information Sheet and instruction to access the V-Safe system.   [name] [name] was instructed to call 911 with any severe reactions post vaccine: Marland Kitchen Difficulty breathing  . Swelling of face and throat  . A fast heartbeat  . A bad rash all over body  . Dizziness and weakness   Immunizations Administered    No immunizations on file.

## 2020-11-21 ENCOUNTER — Other Ambulatory Visit: Payer: Self-pay | Admitting: Internal Medicine

## 2020-12-13 ENCOUNTER — Telehealth: Payer: Self-pay | Admitting: Internal Medicine

## 2020-12-13 DIAGNOSIS — R0683 Snoring: Secondary | ICD-10-CM

## 2020-12-13 NOTE — Telephone Encounter (Signed)
Patient would like to see a specialist for his sleep apnea and wanted to see if he could get a referral

## 2020-12-14 NOTE — Telephone Encounter (Signed)
We will place ENT referral for snoring.  Thanks

## 2020-12-14 NOTE — Telephone Encounter (Signed)
Pt is aware ENT referral has been placed../lm,b

## 2020-12-14 NOTE — Addendum Note (Signed)
Addended by: Cassandria Anger on: 12/14/2020 01:03 PM   Modules accepted: Orders

## 2020-12-20 ENCOUNTER — Ambulatory Visit: Payer: Self-pay | Admitting: Internal Medicine

## 2020-12-22 ENCOUNTER — Other Ambulatory Visit: Payer: Self-pay | Admitting: Internal Medicine

## 2020-12-26 NOTE — Telephone Encounter (Signed)
1.Medication Requested: LORazepam (ATIVAN) 1 MG tablet    2. Pharmacy (Name, Street, [facility]): [facility] [address]   3. On Med List: yes   4. Last Visit with PCP: 8.10.21  5. Next visit date with PCP: 4.7.22   Agent: Please be advised that RX refills may take up to 3 business days. We ask that you follow-up with your pharmacy.

## 2021-02-14 ENCOUNTER — Encounter: Payer: Self-pay | Admitting: Internal Medicine

## 2021-02-14 ENCOUNTER — Other Ambulatory Visit: Payer: Self-pay

## 2021-02-14 ENCOUNTER — Ambulatory Visit: Payer: Self-pay | Admitting: Internal Medicine

## 2021-02-14 DIAGNOSIS — I2510 Atherosclerotic heart disease of native coronary artery without angina pectoris: Secondary | ICD-10-CM

## 2021-02-14 DIAGNOSIS — I10 Essential (primary) hypertension: Secondary | ICD-10-CM

## 2021-02-14 DIAGNOSIS — I2583 Coronary atherosclerosis due to lipid rich plaque: Secondary | ICD-10-CM

## 2021-02-14 MED ORDER — LORAZEPAM 1 MG PO TABS
ORAL_TABLET | ORAL | 5 refills | Status: DC
Start: 2021-02-14 — End: 2021-07-25

## 2021-02-14 MED ORDER — LOSARTAN POTASSIUM 100 MG PO TABS: ORAL_TABLET | ORAL | 1 refills | Status: DC

## 2021-02-14 MED ORDER — PRAVASTATIN SODIUM 40 MG PO TABS
ORAL_TABLET | ORAL | 3 refills | Status: DC
Start: 2021-02-14 — End: 2022-03-18

## 2021-02-14 NOTE — Progress Notes (Signed)
Subjective:  Patient ID: [name] [name], male    DOB: December 31, 1965  Age: 55 y.o. MRN: [id number]  CC: Follow-up (6 month f/u)   HPI [name] [name] presents for pretension and dyslipidemia follow-up  Outpatient Medications Prior to Visit  Medication Sig Dispense Refill  . finasteride (PROSCAR) 5 MG tablet TAKE 1 TABLET ONCE DAILY. 30 tablet 5  . LORazepam (ATIVAN) 1 MG tablet TAKE 1 TABLET TWICE DAILY AS NEEDED FOR ANXIETY. 30 tablet 5  . losartan (COZAAR) 100 MG tablet TAKE 1 TABLET EACH DAY. 90 tablet 1  . Multiple Vitamin (MULTIVITAMIN) tablet Take 1 tablet by mouth daily.    . Omega-3 Fatty Acids (FISH OIL PO) Take 2,000 mg by mouth.    . pravastatin (PRAVACHOL) 40 MG tablet TAKE 1 TABLET EACH DAY. 90 tablet 3  . vitamin E 600 UNIT capsule Take 1 capsule (600 Units total) by mouth daily. 100 capsule 3  . aspirin 81 MG tablet Take 81 mg by mouth daily.   (Patient not taking: Reported on 02/14/2021)     No facility-administered medications prior to visit.    ROS: Review of Systems  Constitutional: Negative for appetite change, fatigue and unexpected weight change.  HENT: Negative for congestion, nosebleeds, sneezing, sore throat and trouble swallowing.   Eyes: Negative for itching and visual disturbance.  Respiratory: Negative for cough.   Cardiovascular: Negative for chest pain, palpitations and leg swelling.  Gastrointestinal: Negative for abdominal distention, blood in stool, diarrhea and nausea.  Genitourinary: Negative for frequency and hematuria.  Musculoskeletal: Negative for back pain, gait problem, joint swelling and neck pain.  Skin: Negative for rash.  Neurological: Negative for dizziness, tremors, speech difficulty and weakness.  Psychiatric/Behavioral: Negative for agitation, dysphoric mood and sleep disturbance. The patient is not nervous/anxious.     Objective:  BP 128/80 (BP Location: Left Arm)   Pulse 86   Temp 98.2 F (36.8 C) (Oral)   Ht 6\' 1"  (1.854 m)   Wt  180 lb 6.4 oz (81.8 kg)   SpO2 96%   BMI 23.80 kg/m   BP Readings from Last 3 Encounters:  02/14/21 128/80  06/19/20 128/82  05/09/19 124/72    Wt Readings from Last 3 Encounters:  02/14/21 180 lb 6.4 oz (81.8 kg)  06/19/20 185 lb 3.2 oz (84 kg)  05/09/19 184 lb (83.5 kg)    Physical Exam  Lab Results  Component Value Date   WBC 6.4 06/19/2020   HGB 15.3 06/19/2020   HCT 45.5 06/19/2020   PLT 300 06/19/2020   GLUCOSE 92 06/19/2020   CHOL 255 (H) 06/19/2020   TRIG 224 (H) 06/19/2020   HDL 56 06/19/2020   LDLDIRECT 133.4 03/14/2008   LDLCALC 161 (H) 06/19/2020   ALT 29 06/19/2020   AST 22 06/19/2020   NA 137 06/19/2020   K 4.2 06/19/2020   CL 100 06/19/2020   CREATININE 0.86 06/19/2020   BUN 14 06/19/2020   CO2 25 06/19/2020   TSH 1.83 06/19/2020   PSA 0.1 06/19/2020    CT CARDIAC SCORING  Addendum Date: 08/26/2018   ADDENDUM REPORT: 08/26/2018 17:40 CLINICAL DATA:  Risk stratification EXAM: Coronary Calcium Score TECHNIQUE: The patient was scanned on a [employer] scanner. Axial non-contrast 3 mm slices were carried out through the heart. The data set was analyzed on a dedicated work station and scored using the [place]. FINDINGS: Non-cardiac: See separate report from [facility]. Ascending Aorta: Normal size, no calcifications. Pericardium: Normal. Coronary  arteries: Normal origin. IMPRESSION: Coronary calcium score of 25. This was 15 percentile for age and sex matched control. Electronically Signed   By: Ena Dawley   On: 08/26/2018 17:40   Result Date: 08/26/2018 EXAM: OVER-READ INTERPRETATION  CT CHEST The following report is an over-read performed by radiologist Dr. Rolm Baptise of [facility], [place] on 08/26/2018. This over-read does not include interpretation of cardiac or coronary anatomy or pathology. The coronary calcium score interpretation by the cardiologist is attached. COMPARISON:  None FINDINGS: Vascular: Heart is normal  size.  Visualized aorta normal caliber. Mediastinum/Nodes: No adenopathy in the lower mediastinum or hila. Lungs/Pleura: Visualized lungs clear.  No effusions. Upper Abdomen: Imaging into the upper abdomen shows no acute findings. Musculoskeletal: Chest wall soft tissues are unremarkable. No acute bony abnormality. IMPRESSION: No acute or significant extracardiac abnormality. Electronically Signed: By: Rolm Baptise M.D. On: 08/26/2018 16:46    Assessment & Plan:     Follow-up: No follow-ups on file.  Walker Kehr, MD

## 2021-02-17 NOTE — Assessment & Plan Note (Signed)
On pravastatin. Labs at [facility]

## 2021-02-17 NOTE — Assessment & Plan Note (Signed)
Labs at [facility]

## 2021-07-24 ENCOUNTER — Other Ambulatory Visit: Payer: Self-pay | Admitting: Internal Medicine

## 2021-08-15 ENCOUNTER — Encounter: Payer: Self-pay | Admitting: Internal Medicine

## 2021-08-15 ENCOUNTER — Other Ambulatory Visit: Payer: Self-pay

## 2021-08-15 ENCOUNTER — Ambulatory Visit (INDEPENDENT_AMBULATORY_CARE_PROVIDER_SITE_OTHER): Payer: Self-pay | Admitting: Internal Medicine

## 2021-08-15 VITALS — BP 128/82 | HR 78 | Temp 98.0°F | Ht 73.0 in | Wt 180.6 lb

## 2021-08-15 DIAGNOSIS — E785 Hyperlipidemia, unspecified: Secondary | ICD-10-CM | POA: Diagnosis not present

## 2021-08-15 DIAGNOSIS — F4323 Adjustment disorder with mixed anxiety and depressed mood: Secondary | ICD-10-CM | POA: Diagnosis not present

## 2021-08-15 DIAGNOSIS — Z0000 Encounter for general adult medical examination without abnormal findings: Secondary | ICD-10-CM

## 2021-08-15 NOTE — Assessment & Plan Note (Signed)
Lorazepam prn  Potential benefits of a long term benzodiazepines  use as well as potential risks  and complications were explained to the patient and were aknowledged.  

## 2021-08-15 NOTE — Progress Notes (Signed)
Subjective:  Patient ID: [name] [name], male    DOB: Aug 01, 1966  Age: 55 y.o. MRN: [id number]  CC: Annual Exam   HPI [name] presents for a well exam  Outpatient Medications Prior to Visit  Medication Sig Dispense Refill   finasteride (PROSCAR) 5 MG tablet TAKE 1 TABLET ONCE DAILY. 30 tablet 5   LORazepam (ATIVAN) 1 MG tablet TAKE 1 TABLET TWICE DAILY AS NEEDED FOR ANXIETY. 30 tablet 5   losartan (COZAAR) 100 MG tablet 1 po qd 90 tablet 1   Multiple Vitamin (MULTIVITAMIN) tablet Take 1 tablet by mouth daily.     Omega-3 Fatty Acids (FISH OIL PO) Take 2,000 mg by mouth.     pravastatin (PRAVACHOL) 40 MG tablet TAKE 1 TABLET EACH DAY. 90 tablet 3   vitamin E 600 UNIT capsule Take 1 capsule (600 Units total) by mouth daily. 100 capsule 3   No facility-administered medications prior to visit.    ROS: Review of Systems  Constitutional:  Negative for appetite change, fatigue and unexpected weight change.  HENT:  Negative for congestion, nosebleeds, sneezing, sore throat and trouble swallowing.   Eyes:  Negative for itching and visual disturbance.  Respiratory:  Negative for cough.   Cardiovascular:  Negative for chest pain, palpitations and leg swelling.  Gastrointestinal:  Negative for abdominal distention, blood in stool, diarrhea and nausea.  Genitourinary:  Negative for frequency and hematuria.  Musculoskeletal:  Negative for back pain, gait problem, joint swelling and neck pain.  Skin:  Negative for rash.  Neurological:  Negative for dizziness, tremors, speech difficulty and weakness.  Psychiatric/Behavioral:  Negative for agitation, dysphoric mood, sleep disturbance and suicidal ideas. The patient is not nervous/anxious.    Objective:  BP 128/82 (BP Location: Left Arm)   Pulse 78   Temp 98 F (36.7 C) (Oral)   Ht 6\' 1"  (1.854 m)   Wt 180 lb 9.6 oz (81.9 kg)   SpO2 98%   BMI 23.83 kg/m   BP Readings from Last 3 Encounters:  08/15/21 128/82  02/14/21 128/80   06/19/20 128/82    Wt Readings from Last 3 Encounters:  08/15/21 180 lb 9.6 oz (81.9 kg)  02/14/21 180 lb 6.4 oz (81.8 kg)  06/19/20 185 lb 3.2 oz (84 kg)    Physical Exam Constitutional:      General: He is not in acute distress.    Appearance: He is well-developed.     Comments: NAD  Eyes:     Conjunctiva/sclera: Conjunctivae normal.     Pupils: Pupils are equal, round, and reactive to light.  Neck:     Thyroid: No thyromegaly.     Vascular: No JVD.  Cardiovascular:     Rate and Rhythm: Normal rate and regular rhythm.     Heart sounds: Normal heart sounds. No murmur heard.   No friction rub. No gallop.  Pulmonary:     Effort: Pulmonary effort is normal. No respiratory distress.     Breath sounds: Normal breath sounds. No wheezing or rales.  Chest:     Chest wall: No tenderness.  Abdominal:     General: Bowel sounds are normal. There is no distension.     Palpations: Abdomen is soft. There is no mass.     Tenderness: There is no abdominal tenderness. There is no guarding or rebound.  Musculoskeletal:        General: No tenderness. Normal range of motion.     Cervical back: Normal range of  motion.  Lymphadenopathy:     Cervical: No cervical adenopathy.  Skin:    General: Skin is warm and dry.     Findings: No rash.  Neurological:     Mental Status: He is alert and oriented to person, place, and time.     Cranial Nerves: No cranial nerve deficit.     Motor: No abnormal muscle tone.     Coordination: Coordination normal.     Gait: Gait normal.     Deep Tendon Reflexes: Reflexes are normal and symmetric.  Psychiatric:        Behavior: Behavior normal.        Thought Content: Thought content normal.        Judgment: Judgment normal.   Pt declined rectal exam   Lab Results  Component Value Date   WBC 6.4 06/19/2020   HGB 15.3 06/19/2020   HCT 45.5 06/19/2020   PLT 300 06/19/2020   GLUCOSE 92 06/19/2020   CHOL 255 (H) 06/19/2020   TRIG 224 (H) 06/19/2020    HDL 56 06/19/2020   LDLDIRECT 133.4 03/14/2008   LDLCALC 161 (H) 06/19/2020   ALT 29 06/19/2020   AST 22 06/19/2020   NA 137 06/19/2020   K 4.2 06/19/2020   CL 100 06/19/2020   CREATININE 0.86 06/19/2020   BUN 14 06/19/2020   CO2 25 06/19/2020   TSH 1.83 06/19/2020   PSA 0.1 06/19/2020    CT CARDIAC SCORING  Addendum Date: 08/26/2018   ADDENDUM REPORT: 08/26/2018 17:40 CLINICAL DATA:  Risk stratification EXAM: Coronary Calcium Score TECHNIQUE: The patient was scanned on a [employer] scanner. Axial non-contrast 3 mm slices were carried out through the heart. The data set was analyzed on a dedicated work station and scored using the [place]. FINDINGS: Non-cardiac: See separate report from [facility]. Ascending Aorta: Normal size, no calcifications. Pericardium: Normal. Coronary arteries: Normal origin. IMPRESSION: Coronary calcium score of 25. This was 39 percentile for age and sex matched control. Electronically Signed   By: Ena Dawley   On: 08/26/2018 17:40   Result Date: 08/26/2018 EXAM: OVER-READ INTERPRETATION  CT CHEST The following report is an over-read performed by radiologist Dr. Rolm Baptise of [facility], [place] on 08/26/2018. This over-read does not include interpretation of cardiac or coronary anatomy or pathology. The coronary calcium score interpretation by the cardiologist is attached. COMPARISON:  None FINDINGS: Vascular: Heart is normal size.  Visualized aorta normal caliber. Mediastinum/Nodes: No adenopathy in the lower mediastinum or hila. Lungs/Pleura: Visualized lungs clear.  No effusions. Upper Abdomen: Imaging into the upper abdomen shows no acute findings. Musculoskeletal: Chest wall soft tissues are unremarkable. No acute bony abnormality. IMPRESSION: No acute or significant extracardiac abnormality. Electronically Signed: By: Rolm Baptise M.D. On: 08/26/2018 16:46    Assessment & Plan:   Problem List Items Addressed This Visit      Adjustment reaction with anxiety and depression    Lorazepam prn  Potential benefits of a long term benzodiazepines  use as well as potential risks  and complications were explained to the patient and were aknowledged.      Dyslipidemia    On Pravastatin      Well adult exam - Primary     We discussed age appropriate health related issues, including available/recomended screening tests and vaccinations. Labs were ordered to be later reviewed . All questions were answered. We discussed one or more of the following - seat belt use, use of sunscreen/sun exposure exercise, fall  risk reduction, second hand smoke exposure, firearm use and storage, seat belt use, a need for adhering to healthy diet and exercise. Labs were ordered.  All questions were answered. 2019 CT IMPRESSION: Coronary calcium score of 25. This was 48 percentile for age  Cologuard       Relevant Orders   TSH   Urinalysis   CBC with Differential/Platelet   Lipid panel   PSA   Comprehensive metabolic panel      Follow-up: Return in about 6 months (around 02/13/2022) for a follow-up visit.  Walker Kehr, MD

## 2021-08-15 NOTE — Assessment & Plan Note (Signed)
  We discussed age appropriate health related issues, including available/recomended screening tests and vaccinations. Labs were ordered to be later reviewed . All questions were answered. We discussed one or more of the following - seat belt use, use of sunscreen/sun exposure exercise, fall risk reduction, second hand smoke exposure, firearm use and storage, seat belt use, a need for adhering to healthy diet and exercise. Labs were ordered.  All questions were answered. 2019 CT IMPRESSION: Coronary calcium score of 25. This was 56 percentile for age  Cologuard

## 2021-08-15 NOTE — Assessment & Plan Note (Signed)
On Pravastatin 

## 2021-09-17 ENCOUNTER — Other Ambulatory Visit: Payer: Self-pay | Admitting: Internal Medicine

## 2021-09-25 ENCOUNTER — Other Ambulatory Visit: Payer: Self-pay | Admitting: Internal Medicine

## 2021-09-25 DIAGNOSIS — E785 Hyperlipidemia, unspecified: Secondary | ICD-10-CM

## 2021-09-25 DIAGNOSIS — I2583 Coronary atherosclerosis due to lipid rich plaque: Secondary | ICD-10-CM

## 2021-09-25 DIAGNOSIS — I2510 Atherosclerotic heart disease of native coronary artery without angina pectoris: Secondary | ICD-10-CM

## 2021-09-25 MED ORDER — EZETIMIBE 10 MG PO TABS: 10.0000 mg | ORAL_TABLET | Freq: Every day | ORAL | 3 refills | Status: DC

## 2021-09-26 ENCOUNTER — Encounter: Payer: Self-pay | Admitting: Cardiovascular Disease

## 2021-10-24 NOTE — Progress Notes (Signed)
Cardiology Office Note:    Date:  10/25/2021   ID:  [name] [name], DOB Oct 25, 1966, MRN [id number]  PCP:  Cassandria Anger, MD   [facility] Providers Cardiologist:  Sanda Klein, MD     Referring MD: Cassandria Anger, MD   No chief complaint on file. [name] [name] is a 55 y.o. male who is being seen today for the evaluation of external carotid artery atherosclerosis and elevated coronary calcium score at the request of Plotnikov, Evie Lacks, MD.   History of Present Illness:    [name] [name] is a 55 y.o. male with a hx of hypertension, hypercholesterolemia and elevated coronary calcium score as well as a history of GERD and esophageal stricture, nephrolithiasis, irritable bowel syndrome.  He is planning to undergo a ENT laser procedure for snoring.  The part of the preprocedural testing involved an x-ray that showed evidence of atherosclerotic calcification in the right external carotid artery.  In 2019 he had a calcium score that was elevated at 25 (68th percentile for age and gender). CT scan performed in 2014 for left lower quadrant pain" mild calcific atherosclerosis" in the abdominal arterial system.  He eats a healthy diet.  He exercises at the gym 3 times a week, approximately an hour each time (25 minutes cardio, the rest is lifting weights on the machines).  The patient specifically denies any chest pain at rest exertion, dyspnea at rest or with exertion, orthopnea, paroxysmal nocturnal dyspnea, syncope, palpitations, focal neurological deficits, intermittent claudication, lower extremity edema, unexplained weight gain, cough, hemoptysis or wheezing.  Most recent labs performed 09/11/2021 showed an unfavorable lipid profile: Total cholesterol 225, triglycerides 125, HDL 61, LDL 142.  He does not have diabetes.  He has never smoked.  He takes losartan for hypertension.  His father had early onset CAD and had a stent placed when he was in his early 44s.  He has a  history of "allergy to statins", but this is inaccurate.  He is taking pravastatin 40 mg daily since April (he has been taking this in the morning) and taking ezetimibe 10 mg daily since 09/25/2021.  Past Medical History:  Diagnosis Date   GERD (gastroesophageal reflux disease)    Hyperlipemia    Seborrheic dermatitis     Past Surgical History:  Procedure Laterality Date   ORCHIECTOMY  1999   Nonmalignant mass; mildly elevated tumor marker preop   UPPER GASTROINTESTINAL ENDOSCOPY  2008   esophageal stricture ; Dr Deatra Ina   VASECTOMY  2008   Dr Amalia Hailey    Current Medications: Current Meds  Medication Sig   Coenzyme Q10 (CO Q10) 100 MG CAPS Take 100 mg by mouth daily in the afternoon.   ezetimibe (ZETIA) 10 MG tablet Take 1 tablet (10 mg total) by mouth daily.   finasteride (PROSCAR) 5 MG tablet TAKE 1 TABLET ONCE DAILY.   LORazepam (ATIVAN) 1 MG tablet TAKE 1 TABLET TWICE DAILY AS NEEDED FOR ANXIETY.   losartan (COZAAR) 100 MG tablet TAKE ONE TABLET BY MOUTH DAILY.   Multiple Vitamin (MULTIVITAMIN) tablet Take 1 tablet by mouth daily.   pravastatin (PRAVACHOL) 40 MG tablet TAKE 1 TABLET EACH DAY.   vitamin E 600 UNIT capsule Take 1 capsule (600 Units total) by mouth daily.     Allergies:   Statins   Social History   Socioeconomic History   Marital status: Married    Spouse name: Not on file   Number of children: Not on file  Years of education: Not on file   Highest education level: Not on file  Occupational History   Not on file  Tobacco Use   Smoking status: Never   Smokeless tobacco: Never  Substance and Sexual Activity   Alcohol use: Yes    Alcohol/week: 10.0 standard drinks    Types: 10 Glasses of wine per week    Comment: socially   Drug use: No   Sexual activity: Yes  Other Topics Concern   Not on file  Social History Narrative   Not on file   Social Determinants of Health   Financial Resource Strain: Not on file  Food Insecurity: Not on file   Transportation Needs: Not on file  Physical Activity: Not on file  Stress: Not on file  Social Connections: Not on file     Family History: The patient's family history includes Cancer (age of onset: 5) in his brother; Heart disease (age of onset: 40) in his father; Heart failure in his maternal grandfather and maternal grandmother; Hyperlipidemia in his mother. There is no history of Diabetes, Stroke, Depression, or Alcohol abuse.  ROS:   Please see the history of present illness.     All other systems reviewed and are negative.  EKGs/Labs/Other Studies Reviewed:    The following studies were reviewed today: Coronary calcium score 2019 Labs from 09/11/2021   EKG:  EKG is ordered today.  The ekg ordered today demonstrates normal sinus rhythm, completely normal tracing ECG from 05/09/2019 was an entirely normal tracing, normal sinus rhythm  Recent Labs: No results found for requested labs within last 8760 hours.  Most recent labs performed 09/11/2021  creatinine 0.9, fasting glucose 90, normal liver function tests, hemoglobin 15.5, TSH 3.86 with normal free T4  Recent Lipid Panel    Component Value Date/Time   CHOL 255 (H) 06/19/2020 1038   TRIG 224 (H) 06/19/2020 1038   HDL 56 06/19/2020 1038   CHOLHDL 4.6 06/19/2020 1038   VLDL 26.6 05/10/2019 0847   LDLCALC 161 (H) 06/19/2020 1038   LDLDIRECT 133.4 03/14/2008 0932   09/11/2021 Total cholesterol 225, triglycerides 125, HDL 61, LDL 142  Risk Assessment/Calculations:           Physical Exam:    VS:  BP 118/82 (BP Location: Left Arm, Patient Position: Sitting, Cuff Size: Large)    Pulse 79    Ht 6\' 1"  (1.854 m)    Wt 185 lb (83.9 kg)    SpO2 98%    BMI 24.41 kg/m     Wt Readings from Last 3 Encounters:  10/25/21 185 lb (83.9 kg)  08/15/21 180 lb 9.6 oz (81.9 kg)  02/14/21 180 lb 6.4 oz (81.8 kg)     GEN: Appears lean and fit.  Well nourished, well developed in no acute distress HEENT: Normal NECK: No JVD;  No carotid bruits LYMPHATICS: No lymphadenopathy CARDIAC: RRR, no murmurs, rubs, gallops RESPIRATORY:  Clear to auscultation without rales, wheezing or rhonchi  ABDOMEN: Soft, non-tender, non-distended MUSCULOSKELETAL:  No edema; No deformity  SKIN: Warm and dry NEUROLOGIC:  Alert and oriented x 3 PSYCHIATRIC:  Normal affect   ASSESSMENT:    1. Elevated coronary artery calcium score   2. Mixed hyperlipidemia    PLAN:    In order of problems listed above:  Elevated coronary calcium score: Slightly above average, but ideally would reduce his risk with more aggressive lipid-lowering, preferably target LDL less than 70 if we can achieve this without significant side effects.  He has had side effects with aspirin ("tears stomach up") and I do not think he needs aspirin for primary prevention. HLP: Currently on pravastatin and ezetimibe, lipids not at target.  Recommend taking the pravastatin in the evening for optimal effect, but I am skeptical that this combination will be enough to bring his LDL to target (definitely less than 100, preferably less than 70).  He has repeat labs with Dr. Alain Marion scheduled in a couple of months.  If his LDL is not at target would suggest switching from pravastatin to rosuvastatin. HTN: Well-controlled.  Occasional mild orthostatic dizziness.       Medication Adjustments/Labs and Tests Ordered: Current medicines are reviewed at length with the patient today.  Concerns regarding medicines are outlined above.  Orders Placed This Encounter  Procedures   EKG 12-Lead   No orders of the defined types were placed in this encounter.   Patient Instructions  Medication Instructions:  TAKE Pravastatin at bedtime  *If you need a refill on your cardiac medications before your next appointment, please call your pharmacy*   Lab Work: None ordered If you have labs (blood work) drawn today and your tests are completely normal, you will receive your results only  by: [place] (if you have MyChart) OR A paper copy in the mail If you have any lab test that is abnormal or we need to change your treatment, we will call you to review the results.   Testing/Procedures: None ordered   Follow-Up: At [facility], you and your health needs are our priority.  As part of our continuing mission to provide you with exceptional heart care, we have created designated Provider Care Teams.  These Care Teams include your primary Cardiologist (physician) and Advanced Practice Providers (APPs -  Physician Assistants and Nurse Practitioners) who all work together to provide you with the care you need, when you need it.  We recommend signing up for the patient portal called "MyChart".  Sign up information is provided on this After Visit Summary.  MyChart is used to connect with patients for Virtual Visits (Telemedicine).  Patients are able to view lab/test results, encounter notes, upcoming appointments, etc.  Non-urgent messages can be sent to your provider as well.   To learn more about what you can do with MyChart, go to [email or web address].    Your next appointment:   12 month(s)  The format for your next appointment:   In Person  Provider:   Sanda Klein, MD   Signed, Sanda Klein, MD  10/25/2021 10:54 AM    [place]

## 2021-10-25 ENCOUNTER — Other Ambulatory Visit: Payer: Self-pay

## 2021-10-25 ENCOUNTER — Encounter: Payer: Self-pay | Admitting: Cardiovascular Disease

## 2021-10-25 ENCOUNTER — Ambulatory Visit (INDEPENDENT_AMBULATORY_CARE_PROVIDER_SITE_OTHER): Payer: Self-pay | Admitting: Cardiovascular Disease

## 2021-10-25 VITALS — BP 118/82 | HR 79 | Ht 73.0 in | Wt 185.0 lb

## 2021-10-25 DIAGNOSIS — E782 Mixed hyperlipidemia: Secondary | ICD-10-CM

## 2021-10-25 DIAGNOSIS — R931 Abnormal findings on diagnostic imaging of heart and coronary circulation: Secondary | ICD-10-CM

## 2021-10-25 NOTE — Patient Instructions (Signed)
Medication Instructions:  TAKE Pravastatin at bedtime  *If you need a refill on your cardiac medications before your next appointment, please call your pharmacy*   Lab Work: None ordered If you have labs (blood work) drawn today and your tests are completely normal, you will receive your results only by: [place] (if you have MyChart) OR A paper copy in the mail If you have any lab test that is abnormal or we need to change your treatment, we will call you to review the results.   Testing/Procedures: None ordered   Follow-Up: At [facility], you and your health needs are our priority.  As part of our continuing mission to provide you with exceptional heart care, we have created designated Provider Care Teams.  These Care Teams include your primary Cardiologist (physician) and Advanced Practice Providers (APPs -  Physician Assistants and Nurse Practitioners) who all work together to provide you with the care you need, when you need it.  We recommend signing up for the patient portal called "MyChart".  Sign up information is provided on this After Visit Summary.  MyChart is used to connect with patients for Virtual Visits (Telemedicine).  Patients are able to view lab/test results, encounter notes, upcoming appointments, etc.  Non-urgent messages can be sent to your provider as well.   To learn more about what you can do with MyChart, go to [email or web address].    Your next appointment:   12 month(s)  The format for your next appointment:   In Person  Provider:   Sanda Klein, MD

## 2021-11-28 ENCOUNTER — Other Ambulatory Visit: Payer: Self-pay | Admitting: Internal Medicine

## 2021-11-29 NOTE — Telephone Encounter (Signed)
Patient is requesting a refill of the following medications: Requested Prescriptions   Pending Prescriptions Disp Refills   LORazepam (ATIVAN) 1 MG tablet [Pharmacy Med Name: lorazepam 1 mg tablet] 30 tablet 5    Sig: TAKE 1 TABLET TWICE DAILY AS NEEDED FOR ANXIETY.    Date of patient request: 11/29/21 Last office visit: 08/15/21  Date of last refill: 07/25/21  Last refill amount: 30, 5 Follow up time period per chart: n/a

## 2021-12-18 ENCOUNTER — Other Ambulatory Visit: Payer: Self-pay | Admitting: Internal Medicine

## 2022-01-10 ENCOUNTER — Encounter: Payer: Self-pay | Admitting: Internal Medicine

## 2022-03-18 ENCOUNTER — Other Ambulatory Visit: Payer: Self-pay | Admitting: Internal Medicine

## 2022-03-23 ENCOUNTER — Other Ambulatory Visit: Payer: Self-pay | Admitting: Internal Medicine

## 2022-03-25 NOTE — Telephone Encounter (Signed)
Check [place] registry last filled  03/01/2022. MD is out of the office until Friday 03/28/22. Pls advise on refill.Marland KitchenJohny Chess ? ?

## 2022-04-14 ENCOUNTER — Other Ambulatory Visit: Payer: Self-pay | Admitting: Internal Medicine

## 2022-04-14 NOTE — Telephone Encounter (Signed)
To PCP please ?

## 2022-05-27 ENCOUNTER — Ambulatory Visit (INDEPENDENT_AMBULATORY_CARE_PROVIDER_SITE_OTHER): Payer: BC Managed Care – PPO

## 2022-05-27 ENCOUNTER — Ambulatory Visit (INDEPENDENT_AMBULATORY_CARE_PROVIDER_SITE_OTHER): Payer: BC Managed Care – PPO | Admitting: Nurse Practitioner

## 2022-05-27 ENCOUNTER — Other Ambulatory Visit: Payer: Self-pay

## 2022-05-27 ENCOUNTER — Encounter: Payer: Self-pay | Admitting: Nurse Practitioner

## 2022-05-27 VITALS — BP 130/74 | HR 88 | Ht 73.0 in | Wt 180.8 lb

## 2022-05-27 DIAGNOSIS — I10 Essential (primary) hypertension: Secondary | ICD-10-CM | POA: Diagnosis not present

## 2022-05-27 DIAGNOSIS — R002 Palpitations: Secondary | ICD-10-CM

## 2022-05-27 DIAGNOSIS — E785 Hyperlipidemia, unspecified: Secondary | ICD-10-CM

## 2022-05-27 DIAGNOSIS — R931 Abnormal findings on diagnostic imaging of heart and coronary circulation: Secondary | ICD-10-CM

## 2022-05-27 NOTE — Progress Notes (Signed)
Office Visit    Patient Name: [name] [name] Date of Encounter: 05/27/2022  Primary Care Provider:  Cassandria Anger, MD Primary Cardiologist:  Sanda Klein, MD  Chief Complaint    56 year old male with a history of elevated coronary calcium score, hypertension, hyperlipidemia, esophageal stricture, GERD, IBS, and nephrolithiasis who presents for follow-up related to hypertension and hyperlipidemia.   Past Medical History    Past Medical History:  Diagnosis Date   GERD (gastroesophageal reflux disease)    Hyperlipemia    Seborrheic dermatitis    Past Surgical History:  Procedure Laterality Date   ORCHIECTOMY  1999   Nonmalignant mass; mildly elevated tumor marker preop   UPPER GASTROINTESTINAL ENDOSCOPY  2008   esophageal stricture ; Dr Deatra Ina   VASECTOMY  2008   Dr Amalia Hailey    Allergies  Allergies  Allergen Reactions   Statins     fatigue    History of Present Illness    56 year old male with the above past medical history including elevated coronary calcium score, hypertension, hyperlipidemia, esophageal stricture, GERD, IBS, and nephrolithiasis.  He was referred to Dr. Sallyanne Kuster in December 2022 by his PCP for external carotid artery atherosclerosis and elevated coronary calcium score.  He had planned to undergo an ENT laser procedure for snoring.  As part of the preprocedural testing he underwent x-ray showed evidence of atherosclerotic calcification of the right external carotid artery.  In 2019 he had a calcium score that was elevated at 25 (60th percentile for age and gender).  CT scan in 2014 for left lower quadrant pain showed "mild calcific atherosclerosis" " in the abdominal arterial system.  He was last seen in the office on 10/25/2021 and was stable from a cardiac standpoint.  He reported occasional mild orthostatic dizziness, he denied any symptoms concerning for angina.  Aggressive lipid-lowering therapy was recommended.   He presents today for  follow-up.  Since his last visit has been stable overall from a cardiac standpoint.  Over the past 2 months however, he has noticed an increase in intermittent palpitations that occur almost daily with associated shortness of breath in the setting of increased stress at work. He denies any dizziness, presyncope, syncope.  He denies any symptoms concerning for angina.  Additionally, he states he has noticed some sexual dysfunction with the higher dose of pravastatin and has since reduced his dose to 20 mg daily.  Other than his palpitations and going elevated cholesterol, he denies any additional concerns today.  Home Medications    Current Outpatient Medications  Medication Sig Dispense Refill   Coenzyme Q10 (CO Q10) 100 MG CAPS Take 100 mg by mouth daily in the afternoon.     ezetimibe (ZETIA) 10 MG tablet Take 1 tablet (10 mg total) by mouth daily. 90 tablet 3   finasteride (PROSCAR) 5 MG tablet TAKE 1 TABLET ONCE DAILY. 90 tablet 1   LORazepam (ATIVAN) 1 MG tablet TAKE 1 TABLET TWICE DAILY AS NEEDED FOR ANXIETY. 30 tablet 3   losartan (COZAAR) 100 MG tablet TAKE ONE TABLET BY MOUTH DAILY. 90 tablet 3   Multiple Vitamin (MULTIVITAMIN) tablet Take 1 tablet by mouth daily.     pravastatin (PRAVACHOL) 40 MG tablet TAKE 1 TABLET EACH DAY. Annual appt due in Oct must see provider for future refills 90 tablet 1   vitamin E 600 UNIT capsule Take 1 capsule (600 Units total) by mouth daily. 100 capsule 3   YOHIMBINE HCL PO Take by mouth.  No current facility-administered medications for this visit.     Review of Systems    He denies chest pain, dyspnea, pnd, orthopnea, n, v, dizziness, syncope, edema, weight gain, or early satiety. All other systems reviewed and are otherwise negative except as noted above.   Physical Exam    VS:  BP 130/74   Pulse 88   Ht '6\' 1"'$  (1.854 m)   Wt 180 lb 12.8 oz (82 kg)   SpO2 97%   BMI 23.85 kg/m  GEN: Well nourished, well developed, in no acute  distress. HEENT: normal. Neck: Supple, no JVD, carotid bruits, or masses. Cardiac: RRR, no murmurs, rubs, or gallops. No clubbing, cyanosis, edema.  Radials/DP/PT 2+ and equal bilaterally.  Respiratory:  Respirations regular and unlabored, clear to auscultation bilaterally. GI: Soft, nontender, nondistended, BS + x 4. MS: no deformity or atrophy. Skin: warm and dry, no rash. Neuro:  Strength and sensation are intact. Psych: Normal affect.  Accessory Clinical Findings    ECG personally reviewed by me today - No EKG in office today.  Lab Results  Component Value Date   WBC 6.4 06/19/2020   HGB 15.3 06/19/2020   HCT 45.5 06/19/2020   MCV 92.3 06/19/2020   PLT 300 06/19/2020   Lab Results  Component Value Date   CREATININE 0.86 06/19/2020   BUN 14 06/19/2020   NA 137 06/19/2020   K 4.2 06/19/2020   CL 100 06/19/2020   CO2 25 06/19/2020   Lab Results  Component Value Date   ALT 29 06/19/2020   AST 22 06/19/2020   ALKPHOS 56 05/10/2019   BILITOT 0.8 06/19/2020   Lab Results  Component Value Date   CHOL 255 (H) 06/19/2020   HDL 56 06/19/2020   LDLCALC 161 (H) 06/19/2020   LDLDIRECT 133.4 03/14/2008   TRIG 224 (H) 06/19/2020   CHOLHDL 4.6 06/19/2020    No results found for: "HGBA1C"  Assessment & Plan    1. Palpitations: Over the past 2 months he has noticed daily intermittent palpitations she describes as his heart "skipping beats" with associated shortness of breath.  He thinks that his palpitations have increased in the setting of increased stress at [facility] are bothersome.  Will check 14-day ZIO monitor to assess for underlying arrhythmia.   2. Elevated coronary calcium score: Calcium score in 2019 was elevated at 25 (60th percentile for age and gender).  Additionally, he has a family history of CAD in his father. He denies symptoms concerning for angina. No indication for ischemic evaluation at this time.  3. Hypertension: BP well controlled. Continue current  antihypertensive regimen.   4. Hyperlipidemia: LDL was 142 in November 2022.  Due to concern for sexual dysfunction as a side effect of increased dose of pravastatin, he decreased his dose to 20 mg daily.  He is taking Zetia 10 mg daily as well.  We will check fasting lipids, LFTs, prior to next visit.  If LDL remains elevated above goal, will transition to Crestor.  If he does not tolerate Crestor, would recommend referral to lipid clinic Pharm.D. for further recommendations.  5. Disposition: Follow-up in 6 to 8 weeks.  Lenna Sciara, NP 05/27/2022, 8:03 AM

## 2022-05-27 NOTE — Patient Instructions (Signed)
Medication Instructions:  Your physician recommends that you continue on your current medications as directed. Please refer to the Current Medication list given to you today.   *If you need a refill on your cardiac medications before your next appointment, please call your pharmacy*   Lab Work: Your physician recommends that you return for lab work 1 week prior to follow up visit. Lipid, hepatic function   If you have labs (blood work) drawn today and your tests are completely normal, you will receive your results only by: MyChart Message (if you have MyChart) OR A paper copy in the mail If you have any lab test that is abnormal or we need to change your treatment, we will call you to review the results.   Testing/Procedures: [place] [place]- Long Term Monitor Instructions  Your physician has requested you wear a ZIO patch monitor for 14 days.  This is a single patch monitor. [employer] supplies one patch monitor per enrollment. Additional stickers are not available. Please do not apply patch if you will be having a Nuclear Stress Test,  Echocardiogram, Cardiac CT, MRI, or Chest Xray during the period you would be wearing the  monitor. The patch cannot be worn during these tests. You cannot remove and re-apply the  ZIO XT patch monitor.  Your ZIO patch monitor will be mailed 3 day USPS to your address on file. It may take 3-5 days  to receive your monitor after you have been enrolled.  Once you have received your monitor, please review the enclosed instructions. Your monitor  has already been registered assigning a specific monitor serial # to you.  Billing and Patient Assistance Program Information  We have supplied [employer] with any of your insurance information on file for billing purposes. [employer] offers a sliding scale Patient Assistance Program for patients that do not have  insurance, or whose insurance does not completely cover the cost of the ZIO monitor.  You must apply for the  Patient Assistance Program to qualify for this discounted rate.  To apply, please call [employer] at [phone], select option 4, select option 2, ask to apply for  Patient Assistance Program. Theodore Demark will ask your household income, and how many people  are in your household. They will quote your out-of-pocket cost based on that information.  [employer] will also be able to set up a 80-month interest-free payment plan if needed.  Applying the monitor   Shave hair from upper left chest.  Hold abrader disc by orange tab. Rub abrader in 40 strokes over the upper left chest as  indicated in your monitor instructions.  Clean area with 4 enclosed alcohol pads. Let dry.  Apply patch as indicated in monitor instructions. Patch will be placed under collarbone on left  side of chest with arrow pointing upward.  Rub patch adhesive wings for 2 minutes. Remove white label marked "1". Remove the white  label marked "2". Rub patch adhesive wings for 2 additional minutes.  While looking in a mirror, press and release button in center of patch. A small green light will  flash 3-4 times. This will be your only indicator that the monitor has been turned on.  Do not shower for the first 24 hours. You may shower after the first 24 hours.  Press the button if you feel a symptom. You will hear a small click. Record Date, Time and  Symptom in the Patient Logbook.  When you are ready to remove the patch, follow instructions on the last  2 pages of Patient  Logbook. Stick patch monitor onto the last page of Patient Logbook.  Place Patient Logbook in the blue and white box. Use locking tab on box and tape box closed  securely. The blue and white box has prepaid postage on it. Please place it in the mailbox as  soon as possible. Your physician should have your test results approximately 7 days after the  monitor has been mailed back to [facility].  Call [place] at [phone] if you have  questions regarding  your ZIO XT patch monitor. Call them immediately if you see an orange light blinking on your  monitor.  If your monitor falls off in less than 4 days, contact our Monitor department at [phone].  If your monitor becomes loose or falls off after 4 days call [employer] at [phone] for  suggestions on securing your monitor    Follow-Up: At [facility], you and your health needs are our priority.  As part of our continuing mission to provide you with exceptional heart care, we have created designated Provider Care Teams.  These Care Teams include your primary Cardiologist (physician) and Advanced Practice Providers (APPs -  Physician Assistants and Nurse Practitioners) who all work together to provide you with the care you need, when you need it.  We recommend signing up for the patient portal called "MyChart".  Sign up information is provided on this After Visit Summary.  MyChart is used to connect with patients for Virtual Visits (Telemedicine).  Patients are able to view lab/test results, encounter notes, upcoming appointments, etc.  Non-urgent messages can be sent to your provider as well.   To learn more about what you can do with MyChart, go to [email or web address].    Your next appointment:   6-8 week(s)  The format for your next appointment:   In Person  Provider:   Diona Browner, NP        Other Instructions   Important Information About Sugar

## 2022-05-27 NOTE — Progress Notes (Unsigned)
Enrolled patient for a 14 day Zio XT monitor to be mailed to patients home  Dr Croitoru to read 

## 2022-05-30 DIAGNOSIS — R002 Palpitations: Secondary | ICD-10-CM

## 2022-06-20 ENCOUNTER — Other Ambulatory Visit: Payer: Self-pay | Admitting: Internal Medicine

## 2022-06-20 NOTE — Telephone Encounter (Signed)
Check [place] registry last filled 06/08/2022../lmb  

## 2022-07-01 ENCOUNTER — Telehealth: Payer: Self-pay

## 2022-07-01 NOTE — Telephone Encounter (Signed)
Lmom to discuss monitor results. Waiting on a return call.

## 2022-07-03 NOTE — Telephone Encounter (Signed)
Lmom to discuss results and r/s 8/29 apt.

## 2022-07-08 ENCOUNTER — Ambulatory Visit: Payer: Self-pay | Admitting: Nurse Practitioner

## 2022-07-11 LAB — LIPID PANEL
Chol/HDL Ratio: 2.7 ratio (ref 0.0–5.0)
Cholesterol, Total: 189 mg/dL (ref 100–199)
HDL: 71 mg/dL (ref >39)
LDL Chol Calc (NIH): 103 mg/dL — ABNORMAL HIGH (ref 0–99)
Triglycerides: 84 mg/dL (ref 0–149)
VLDL Cholesterol Cal: 15 mg/dL (ref 5–40)

## 2022-07-11 LAB — HEPATIC FUNCTION PANEL
ALT: 34 IU/L (ref 0–44)
AST: 26 IU/L (ref 0–40)
Albumin: 4.6 g/dL (ref 3.8–4.9)
Alkaline Phosphatase: 82 IU/L (ref 44–121)
Bilirubin Total: 0.6 mg/dL (ref 0.0–1.2)
Bilirubin, Direct: 0.17 mg/dL (ref 0.00–0.40)
Total Protein: 7.6 g/dL (ref 6.0–8.5)

## 2022-07-15 ENCOUNTER — Telehealth: Payer: Self-pay

## 2022-07-15 NOTE — Telephone Encounter (Signed)
Lmom to discuss lab results. Waiting on a return call.  

## 2022-07-17 ENCOUNTER — Ambulatory Visit: Payer: BC Managed Care – PPO | Attending: Nurse Practitioner | Admitting: Nurse Practitioner

## 2022-07-17 ENCOUNTER — Encounter: Payer: Self-pay | Admitting: Nurse Practitioner

## 2022-07-17 VITALS — BP 142/82 | HR 72 | Ht 73.0 in | Wt 183.0 lb

## 2022-07-17 DIAGNOSIS — E785 Hyperlipidemia, unspecified: Secondary | ICD-10-CM

## 2022-07-17 DIAGNOSIS — I10 Essential (primary) hypertension: Secondary | ICD-10-CM | POA: Diagnosis not present

## 2022-07-17 DIAGNOSIS — I493 Ventricular premature depolarization: Secondary | ICD-10-CM | POA: Diagnosis not present

## 2022-07-17 DIAGNOSIS — R002 Palpitations: Secondary | ICD-10-CM | POA: Diagnosis not present

## 2022-07-17 DIAGNOSIS — R931 Abnormal findings on diagnostic imaging of heart and coronary circulation: Secondary | ICD-10-CM | POA: Diagnosis not present

## 2022-07-17 MED ORDER — ROSUVASTATIN CALCIUM 20 MG PO TABS: 20.0000 mg | ORAL_TABLET | Freq: Every day | ORAL | 3 refills | Status: DC

## 2022-07-17 NOTE — Patient Instructions (Addendum)
Medication Instructions:  STOP Pravastatin  START Rosuvastatin 20 mg daily  *If you need a refill on your cardiac medications before your next appointment, please call your pharmacy*  Lab Work: NONE ordered at this time of appointment   If you have labs (blood work) drawn today and your tests are completely normal, you will receive your results only by: [place] (if you have MyChart) OR A paper copy in the mail If you have any lab test that is abnormal or we need to change your treatment, we will call you to review the results.  Testing/Procedures: NONE ordered at this time of appointment   Follow-Up: At [facility], you and your health needs are our priority.  As part of our continuing mission to provide you with exceptional heart care, we have created designated Provider Care Teams.  These Care Teams include your primary Cardiologist (physician) and Advanced Practice Providers (APPs -  Physician Assistants and Nurse Practitioners) who all work together to provide you with the care you need, when you need it.   Your next appointment:   As previously scheduled   The format for your next appointment:   In Person  Provider:   Sanda Klein, MD     Other Instructions  Important Information About Sugar

## 2022-07-17 NOTE — Progress Notes (Addendum)
Office Visit    Patient Name: [name] [name] Date of Encounter: 07/17/2022  Primary Care Provider:  Cassandria Anger, MD Primary Cardiologist:  Sanda Klein, MD  Chief Complaint    56 year old male with a history of elevated coronary calcium score, hypertension, hyperlipidemia, esophageal stricture, GERD, IBS, and nephrolithiasis who presents for follow-up related to hypertension, hyperlipidemia, and palpitations.   Past Medical History    Past Medical History:  Diagnosis Date   GERD (gastroesophageal reflux disease)    Hyperlipemia    Seborrheic dermatitis    Past Surgical History:  Procedure Laterality Date   ORCHIECTOMY  1999   Nonmalignant mass; mildly elevated tumor marker preop   UPPER GASTROINTESTINAL ENDOSCOPY  2008   esophageal stricture ; Dr Deatra Ina   VASECTOMY  2008   Dr Amalia Hailey    Allergies  Allergies  Allergen Reactions   Statins     fatigue    History of Present Illness    56 year old male with the above past medical history including elevated coronary calcium score, hypertension, hyperlipidemia, esophageal stricture, GERD, IBS, and nephrolithiasis.   He was referred to Dr. Sallyanne Kuster in December 2022 by his PCP for external carotid artery atherosclerosis and elevated coronary calcium score.  He had planned to undergo an ENT laser procedure for snoring.  As part of the preprocedural testing he underwent x-ray showed evidence of atherosclerotic calcification of the right external carotid artery.  In 2019 he had a calcium score that was elevated at 25 (60th percentile for age and gender).  CT scan in 2014 for left lower quadrant pain showed "mild calcific atherosclerosis" " in the abdominal arterial system. Aggressive lipid-lowering therapy was recommended.  Seen in the office on 05/27/2022 and reported intermittent palpitations with associated shortness of breath in the setting of increased stress at work.  Additionally, he reported sexual dysfunction with  increased dose of pravastatin and had self titrated his dose back down to 20 mg daily.  Cardiac monitor showed predominantly durable sinus rhythm with single 4 beat run of NSVT, 2 brief episodes of nonsustained atrial tachycardia, rare PVCs, no significant arrhythmia.  Patient triggers correlated with PVCs, but inconsistently so.   He presents today for follow-up.  Since his last visit he has been stable overall from a cardiac standpoint.  He notes an overall improvement in his palpitations.  He denies any associated symptoms, he is feeling less stressed overall at work.  He is glad that his cholesterol has improved overall, however, he is concerned that is still elevated above goal.  Otherwise, he reports feeling well and denies any additional concerns today.  Home Medications    Current Outpatient Medications  Medication Sig Dispense Refill   rosuvastatin (CRESTOR) 20 MG tablet Take 1 tablet (20 mg total) by mouth daily. 90 tablet 3   Coenzyme Q10 (CO Q10) 100 MG CAPS Take 100 mg by mouth daily in the afternoon.     ezetimibe (ZETIA) 10 MG tablet Take 1 tablet (10 mg total) by mouth daily. 90 tablet 3   finasteride (PROSCAR) 5 MG tablet TAKE 1 TABLET ONCE DAILY. 90 tablet 1   LORazepam (ATIVAN) 1 MG tablet TAKE 1 TABLET TWICE DAILY AS NEEDED FOR ANXIETY. 30 tablet 3   losartan (COZAAR) 100 MG tablet TAKE ONE TABLET BY MOUTH DAILY. 90 tablet 3   Multiple Vitamin (MULTIVITAMIN) tablet Take 1 tablet by mouth daily.     vitamin E 600 UNIT capsule Take 1 capsule (600 Units total) by mouth  daily. 100 capsule 3   YOHIMBINE HCL PO Take by mouth.     No current facility-administered medications for this visit.     Review of Systems    He denies chest pain, dyspnea, pnd, orthopnea, n, v, dizziness, syncope, edema, weight gain, or early satiety. All other systems reviewed and are otherwise negative except as noted above.   Physical Exam    VS:  BP (!) 142/82   Pulse 72   Ht '6\' 1"'$  (1.854 m)   Wt  183 lb (83 kg)   SpO2 98%   BMI 24.14 kg/m  GEN: Well nourished, well developed, in no acute distress. HEENT: normal. Neck: Supple, no JVD, carotid bruits, or masses. Cardiac: RRR, no murmurs, rubs, or gallops. No clubbing, cyanosis, edema.  Radials/DP/PT 2+ and equal bilaterally.  Respiratory:  Respirations regular and unlabored, clear to auscultation bilaterally. GI: Soft, nontender, nondistended, BS + x 4. MS: no deformity or atrophy. Skin: warm and dry, no rash. Neuro:  Strength and sensation are intact. Psych: Normal affect.  Accessory Clinical Findings    ECG personally reviewed by me today - NSR, 72 bpm - no acute changes.   Lab Results  Component Value Date   WBC 6.4 06/19/2020   HGB 15.3 06/19/2020   HCT 45.5 06/19/2020   MCV 92.3 06/19/2020   PLT 300 06/19/2020   Lab Results  Component Value Date   CREATININE 0.86 06/19/2020   BUN 14 06/19/2020   NA 137 06/19/2020   K 4.2 06/19/2020   CL 100 06/19/2020   CO2 25 06/19/2020   Lab Results  Component Value Date   ALT 34 07/11/2022   AST 26 07/11/2022   ALKPHOS 82 07/11/2022   BILITOT 0.6 07/11/2022   Lab Results  Component Value Date   CHOL 189 07/11/2022   HDL 71 07/11/2022   LDLCALC 103 (H) 07/11/2022   LDLDIRECT 133.4 03/14/2008   TRIG 84 07/11/2022   CHOLHDL 2.7 07/11/2022    No results found for: "HGBA1C"  Assessment & Plan    1. Palpitations/PVCs: Cardiac monitor showed predominantly durable sinus rhythm with [place] single 4 beat run of NSVT, 2 brief episodes of nonsustained atrial tachycardia, rare PVCs, no significant arrhythmia.  Patient triggers correlated with PVCs, but inconsistently so.  He continues to note occasional palpitations but overall, his symptoms have improved.  Discussed triggers, possible treatment with beta-blocker therapy (daily metoprolol versus as needed propranolol).  He declines beta-blocker therapy at this time.  Continue to monitor symptoms.   2. Elevated coronary calcium  score: Calcium score in 2019 was elevated at 25 (60th percentile for age and gender).  Additionally, he has a family history of CAD in his father. He denies symptoms concerning for angina. No indication for ischemic evaluation at this time.  Continue Crestor, Zetia.   3. Hypertension: BP elevated in office today.  He has not taken his losartan this morning.  Continue to monitor.  For now, continue current antihypertensive regimen.   4. Hyperlipidemia: LDL was 103 in 07/2022.  Due to concern for sexual dysfunction as a side effect of increased dose of pravastatin, he decreased his dose to 20 mg daily.  He is taking Zetia 10 mg daily as well.  LDL remains above goal of < 70, will stop pravastatin and start rosuvastatin 20 mg daily.  If he does not tolerate Crestor, would recommend referral to lipid clinic Pharm.D. for further recommendations.   5. Disposition: Follow-up as scheduled with Dr. Sallyanne Kuster in  October 2023.   HYPERTENSION CONTROL Vitals:   07/17/22 0832 07/17/22 0902  BP: (!) 142/84 (!) 142/82    The patient's blood pressure is elevated above target today.  In order to address the patient's elevated BP: Blood pressure will be monitored at home to determine if medication changes need to be made.     Lenna Sciara, NP 07/17/2022, 9:06 AM

## 2022-07-30 ENCOUNTER — Telehealth: Payer: Self-pay

## 2022-07-30 NOTE — Telephone Encounter (Signed)
Lab results reviewed via mychart by pt.  

## 2022-08-20 ENCOUNTER — Ambulatory Visit: Payer: BC Managed Care – PPO | Attending: Cardiovascular Disease | Admitting: Cardiovascular Disease

## 2022-08-20 ENCOUNTER — Encounter: Payer: Self-pay | Admitting: Cardiovascular Disease

## 2022-08-20 VITALS — BP 106/86 | HR 83 | Ht 73.0 in | Wt 183.0 lb

## 2022-08-20 DIAGNOSIS — R931 Abnormal findings on diagnostic imaging of heart and coronary circulation: Secondary | ICD-10-CM

## 2022-08-20 DIAGNOSIS — I10 Essential (primary) hypertension: Secondary | ICD-10-CM | POA: Diagnosis not present

## 2022-08-20 DIAGNOSIS — I700 Atherosclerosis of aorta: Secondary | ICD-10-CM | POA: Diagnosis not present

## 2022-08-20 DIAGNOSIS — E785 Hyperlipidemia, unspecified: Secondary | ICD-10-CM | POA: Diagnosis not present

## 2022-08-20 NOTE — Progress Notes (Signed)
Cardiology Office Note:    Date:  08/20/2022   ID:  [name] [name], DOB 12-17-1965, MRN [id number]  PCP:  Cassandria Anger, MD   [facility] Providers Cardiologist:  Sanda Klein, MD     Referring MD: Cassandria Anger, MD   Chief Complaint  Patient presents with   Hyperlipidemia    History of Present Illness:    [name] [name] is a 56 y.o. male with a hx of hypertension, hypercholesterolemia and elevated coronary calcium score (60th percentile), imaging evidence of atherosclerosis in the external carotid artery and abdominal aorta, as well as a history of GERD and esophageal stricture, nephrolithiasis, irritable bowel syndrome.  He was having some problems with palpitations about 2 months ago, described as a need to take a sudden deep breath, but without presyncope/syncope, chest pain or true dyspnea.  He wore an event monitor that showed minor abnormalities (2 very brief episodes of nonsustained atrial tachycardia and a single 4 beat run of nonsustained VT as well as PVCs.  Symptoms largely correlated to his PVCs).  Over the last month his palpitations have subsided completely.  He may have been under more than usual emotional stress earlier in the year.  His repeat lipid profile showed an LDL cholesterol of 103 despite taking maximum dose pravastatin in the evening plus ezetimibe.  He has now switched to rosuvastatin 20 mg daily for about a month.  The patient is still healthy diet and goes to the gym for about an hour at a time 3 days a week.  He has not needed it.  He has maintained the same routine without dyspnea or chest pain .  The patient specifically denies any chest pain at rest exertion, dyspnea at rest or with exertion, orthopnea, paroxysmal nocturnal dyspnea, syncope, palpitations, focal neurological deficits, intermittent claudication, lower extremity edema, unexplained weight gain, cough, hemoptysis or wheezing.   His father had early onset CAD and had a stent  placed when he was in his early 77s.  He has a history of "allergy to statins", but this is inaccurate.  He took pravastatin for a long time without problems and is currently on rosuvastatin for about a month without side effects.  Past Medical History:  Diagnosis Date   GERD (gastroesophageal reflux disease)    Hyperlipemia    Seborrheic dermatitis     Past Surgical History:  Procedure Laterality Date   ORCHIECTOMY  1999   Nonmalignant mass; mildly elevated tumor marker preop   UPPER GASTROINTESTINAL ENDOSCOPY  2008   esophageal stricture ; Dr Deatra Ina   VASECTOMY  2008   Dr Amalia Hailey    Current Medications: Current Meds  Medication Sig   Coenzyme Q10 (CO Q10) 100 MG CAPS Take 100 mg by mouth daily in the afternoon.   ezetimibe (ZETIA) 10 MG tablet Take 1 tablet (10 mg total) by mouth daily.   finasteride (PROSCAR) 5 MG tablet TAKE 1 TABLET ONCE DAILY.   LORazepam (ATIVAN) 1 MG tablet TAKE 1 TABLET TWICE DAILY AS NEEDED FOR ANXIETY.   losartan (COZAAR) 100 MG tablet TAKE ONE TABLET BY MOUTH DAILY.   Multiple Vitamin (MULTIVITAMIN) tablet Take 1 tablet by mouth daily.   rosuvastatin (CRESTOR) 20 MG tablet Take 1 tablet (20 mg total) by mouth daily.   vitamin E 600 UNIT capsule Take 1 capsule (600 Units total) by mouth daily.   YOHIMBINE HCL PO Take 1 tablet by mouth daily in the afternoon.     Allergies:   Statins  Social History   Socioeconomic History   Marital status: Married    Spouse name: Not on file   Number of children: Not on file   Years of education: Not on file   Highest education level: Not on file  Occupational History   Not on file  Tobacco Use   Smoking status: Never   Smokeless tobacco: Never  Substance and Sexual Activity   Alcohol use: Yes    Alcohol/week: 10.0 standard drinks of alcohol    Types: 10 Glasses of wine per week    Comment: socially   Drug use: No   Sexual activity: Yes  Other Topics Concern   Not on file  Social History Narrative    Not on file   Social Determinants of Health   Financial Resource Strain: Not on file  Food Insecurity: Not on file  Transportation Needs: Not on file  Physical Activity: Not on file  Stress: Not on file  Social Connections: Not on file     Family History: The patient's family history includes Cancer (age of onset: 45) in his brother; Heart disease (age of onset: 106) in his father; Heart failure in his maternal grandfather and maternal grandmother; Hyperlipidemia in his mother. There is no history of Diabetes, Stroke, Depression, or Alcohol abuse.  ROS:   Please see the history of present illness.     All other systems reviewed and are negative.  EKGs/Labs/Other Studies Reviewed:    The following studies were reviewed today: Coronary calcium score 2019 Event monitor 06/19/2022    The dominant rhythm is normal sinus rhythm with nrmal circadian variability.   There is a single 4-beat run of nonsustained VT (4-beats) and 2 very brief episodes on nonsustained atrial tachycardia.   There are rare PVCs.   There is no significant bradycardia, AV block or atrial fibrillation.   A few symptom-triggered recordings were submitted. They appear to correlate with PVCs, but inconsistently so.     Mostly normal event monitor. Symptoms may correlate with premature ventricular contractions.  The   EKG:  EKG is ordered today.  The ekg ordered today demonstrates normal sinus rhythm, completely normal tracing ECG from 05/09/2019 was an entirely normal tracing, normal sinus rhythm  Recent Labs: 07/11/2022: ALT 34  Most recent labs performed 09/11/2021  creatinine 0.9, fasting glucose 90, normal liver function tests, hemoglobin 15.5, TSH 3.86 with normal free T4  Recent Lipid Panel    Component Value Date/Time   CHOL 189 07/11/2022 1108   TRIG 84 07/11/2022 1108   HDL 71 07/11/2022 1108   CHOLHDL 2.7 07/11/2022 1108   CHOLHDL 4.6 06/19/2020 1038   VLDL 26.6 05/10/2019 0847   LDLCALC 103  (H) 07/11/2022 1108   LDLCALC 161 (H) 06/19/2020 1038   LDLDIRECT 133.4 03/14/2008 0932   09/11/2021 Total cholesterol 225, triglycerides 125, HDL 61, LDL 142  Risk Assessment/Calculations:           Physical Exam:    VS:  BP 106/86 (BP Location: Left Arm, Patient Position: Sitting, Cuff Size: Normal)   Pulse 83   Ht '6\' 1"'$  (1.854 m)   Wt 183 lb (83 kg)   SpO2 95%   BMI 24.14 kg/m     Wt Readings from Last 3 Encounters:  08/20/22 183 lb (83 kg)  07/17/22 183 lb (83 kg)  05/27/22 180 lb 12.8 oz (82 kg)     General: Alert, oriented x3, no distress, appears lean and fit Head: no evidence of trauma, PERRL,  EOMI, no exophtalmos or lid lag, no myxedema, no xanthelasma; normal ears, nose and oropharynx Neck: normal jugular venous pulsations and no hepatojugular reflux; brisk carotid pulses without delay and no carotid bruits Chest: clear to auscultation, no signs of consolidation by percussion or palpation, normal fremitus, symmetrical and full respiratory excursions Cardiovascular: normal position and quality of the apical impulse, regular rhythm, normal first and second heart sounds, no murmurs, rubs or gallops Abdomen: no tenderness or distention, no masses by palpation, no abnormal pulsatility or arterial bruits, normal bowel sounds, no hepatosplenomegaly Extremities: no clubbing, cyanosis or edema; 2+ radial, ulnar and brachial pulses bilaterally; 2+ right femoral, posterior tibial and dorsalis pedis pulses; 2+ left femoral, posterior tibial and dorsalis pedis pulses; no subclavian or femoral bruits Neurological: grossly nonfocal Psych: Normal mood and affect   ASSESSMENT:    1. Elevated coronary artery calcium score   2. Atherosclerosis of aorta ([place])   3. Hyperlipidemia LDL goal <70   4. Essential hypertension     PLAN:    In order of problems listed above:  Elevated coronary calcium score/external carotid and aortic atherosclerosis: Asymptomatic.  Abnormalities  discovered incidentally on imaging studies.  Focus on mitigating risk factors, in particular lowering LDL cholesterol. HLP: Insufficient reduction in LDL with pravastatin plus ezetimibe, now taking rosuvastatin.  Repeat labs in about another month. HTN: Very well controlled on monotherapy with losartan.  In fact blood pressure runs in the low normal range.  Does not have any dizziness.  Tolerating yohimbine for ED without side effects.     Medication Adjustments/Labs and Tests Ordered: Current medicines are reviewed at length with the patient today.  Concerns regarding medicines are outlined above.  Orders Placed This Encounter  Procedures   Lipid panel   No orders of the defined types were placed in this encounter.    Patient Instructions  Medication Instructions:  No changes *If you need a refill on your cardiac medications before your next appointment, please call your pharmacy*   Lab Work: Your provider would like for you to return in one month to have the following labs drawn: fasting Lipid. You do not need an appointment for the lab. Once in our office lobby there is a podium where you can sign in and ring the doorbell to alert [place] that you are here. The lab is open from 8:00 am to 4 pm; closed for lunch from 12:45pm-1:45pm.  You may also go to any of these [facility] locations:   [place] ([place]) - [place] [employer] [address]   If you have labs (blood work) drawn today and your tests are completely normal, you will receive your results only by: [employer] (if you have MyChart) OR A paper copy in the mail If you have any lab test that is abnormal or we need to change your treatment, we will call you to review the results.   Testing/Procedures: None ordered   Follow-Up: At [facility], you and your health needs are our priority.  As part of our continuing mission to provide  you with exceptional heart care, we have created designated Provider Care Teams.  These Care Teams include your primary Cardiologist (physician) and Advanced Practice Providers (APPs -  Physician Assistants and Nurse Practitioners) who all work together to provide you with the care you need, when you need it.  We recommend signing up for the patient portal called "MyChart".  Sign up information is provided on this After Visit Summary.  MyChart is used to connect with patients for Virtual Visits (Telemedicine).  Patients are able to view lab/test results, encounter notes, upcoming appointments, etc.  Non-urgent messages can be sent to your provider as well.   To learn more about what you can do with MyChart, go to [email or web address].    Your next appointment:   12 month(s)  The format for your next appointment:   In Person  Provider:   Sanda Klein, MD         Signed, Sanda Klein, MD  08/20/2022 2:45 PM    [place]

## 2022-08-20 NOTE — Patient Instructions (Signed)
Medication Instructions:  No changes *If you need a refill on your cardiac medications before your next appointment, please call your pharmacy*   Lab Work: Your provider would like for you to return in one month to have the following labs drawn: fasting Lipid. You do not need an appointment for the lab. Once in our office lobby there is a podium where you can sign in and ring the doorbell to alert [place] that you are here. The lab is open from 8:00 am to 4 pm; closed for lunch from 12:45pm-1:45pm.  You may also go to any of these [facility] locations:   [place] ([place]) - [place] [employer] [address]   If you have labs (blood work) drawn today and your tests are completely normal, you will receive your results only by: [employer] (if you have MyChart) OR A paper copy in the mail If you have any lab test that is abnormal or we need to change your treatment, we will call you to review the results.   Testing/Procedures: None ordered   Follow-Up: At [facility], you and your health needs are our priority.  As part of our continuing mission to provide you with exceptional heart care, we have created designated Provider Care Teams.  These Care Teams include your primary Cardiologist (physician) and Advanced Practice Providers (APPs -  Physician Assistants and Nurse Practitioners) who all work together to provide you with the care you need, when you need it.  We recommend signing up for the patient portal called "MyChart".  Sign up information is provided on this After Visit Summary.  MyChart is used to connect with patients for Virtual Visits (Telemedicine).  Patients are able to view lab/test results, encounter notes, upcoming appointments, etc.  Non-urgent messages can be sent to your provider as well.   To learn more about what you can do with MyChart, go to [email or web address].    Your next  appointment:   12 month(s)  The format for your next appointment:   In Person  Provider:   Sanda Klein, MD

## 2022-09-15 ENCOUNTER — Other Ambulatory Visit: Payer: Self-pay | Admitting: Internal Medicine

## 2022-09-16 ENCOUNTER — Encounter: Payer: Self-pay | Admitting: Internal Medicine

## 2022-09-25 ENCOUNTER — Other Ambulatory Visit: Payer: Self-pay | Admitting: Internal Medicine

## 2022-09-25 ENCOUNTER — Encounter: Payer: Self-pay | Admitting: Internal Medicine

## 2022-10-28 ENCOUNTER — Other Ambulatory Visit: Payer: Self-pay | Admitting: Internal Medicine

## 2022-10-29 DIAGNOSIS — D22121 Melanocytic nevi of left upper eyelid, including canthus: Secondary | ICD-10-CM | POA: Diagnosis not present

## 2022-10-29 DIAGNOSIS — L738 Other specified follicular disorders: Secondary | ICD-10-CM | POA: Diagnosis not present

## 2022-10-29 DIAGNOSIS — L570 Actinic keratosis: Secondary | ICD-10-CM | POA: Diagnosis not present

## 2022-10-29 DIAGNOSIS — D2261 Melanocytic nevi of right upper limb, including shoulder: Secondary | ICD-10-CM | POA: Diagnosis not present

## 2022-10-29 DIAGNOSIS — L218 Other seborrheic dermatitis: Secondary | ICD-10-CM | POA: Diagnosis not present

## 2022-10-30 ENCOUNTER — Other Ambulatory Visit: Payer: Self-pay | Admitting: Internal Medicine

## 2022-10-30 ENCOUNTER — Encounter: Payer: Self-pay | Admitting: Internal Medicine

## 2022-11-07 ENCOUNTER — Encounter: Payer: Self-pay | Admitting: Internal Medicine

## 2022-11-18 ENCOUNTER — Ambulatory Visit: Payer: BC Managed Care – PPO | Admitting: Internal Medicine

## 2022-11-18 ENCOUNTER — Encounter: Payer: Self-pay | Admitting: Internal Medicine

## 2022-11-18 VITALS — BP 120/72 | HR 71 | Temp 97.5°F | Ht 73.0 in | Wt 182.0 lb

## 2022-11-18 DIAGNOSIS — I10 Essential (primary) hypertension: Secondary | ICD-10-CM | POA: Diagnosis not present

## 2022-11-18 DIAGNOSIS — I2510 Atherosclerotic heart disease of native coronary artery without angina pectoris: Secondary | ICD-10-CM

## 2022-11-18 DIAGNOSIS — N23 Unspecified renal colic: Secondary | ICD-10-CM

## 2022-11-18 DIAGNOSIS — E785 Hyperlipidemia, unspecified: Secondary | ICD-10-CM | POA: Diagnosis not present

## 2022-11-18 DIAGNOSIS — I2583 Coronary atherosclerosis due to lipid rich plaque: Secondary | ICD-10-CM

## 2022-11-18 MED ORDER — HYDROCODONE-ACETAMINOPHEN 5-325 MG PO TABS: 1.0000 | ORAL_TABLET | ORAL | 0 refills | Status: DC | PRN

## 2022-11-18 MED ORDER — KETOROLAC TROMETHAMINE 10 MG PO TABS: 10.0000 mg | ORAL_TABLET | Freq: Three times a day (TID) | ORAL | 0 refills | Status: DC

## 2022-11-18 MED ORDER — LOSARTAN POTASSIUM 100 MG PO TABS: ORAL_TABLET | ORAL | 3 refills | Status: DC

## 2022-11-18 NOTE — Assessment & Plan Note (Signed)
New  Toradol po Norco po prn Pt has Flomax prn Urol ref

## 2022-11-18 NOTE — Assessment & Plan Note (Signed)
Re-start Losartan 

## 2022-11-18 NOTE — Progress Notes (Signed)
Subjective:  Patient ID: [name] [name], male    DOB: 04/30/1966  Age: 57 y.o. MRN: [id number]  CC: Follow-up   HPI FREMON ZACHARIA presents for blood in the urine in [place] - kidney stone (late Dec 2023). Macedonio passed a stone in the bladder F/u on HTN, dyslipidemia  Outpatient Medications Prior to Visit  Medication Sig Dispense Refill   Coenzyme Q10 (CO Q10) 100 MG CAPS Take 100 mg by mouth daily in the afternoon.     ezetimibe (ZETIA) 10 MG tablet Take 1 tablet (10 mg total) by mouth daily. 90 tablet 3   finasteride (PROSCAR) 5 MG tablet Take 1 tablet (5 mg total) by mouth daily. Overdue for yearly appt must see MD for refills 30 tablet 0   LORazepam (ATIVAN) 1 MG tablet TAKE ONE TABLET TWICE DAILY AS NEEDED FOR ANXIETY 30 tablet 3   Multiple Vitamin (MULTIVITAMIN) tablet Take 1 tablet by mouth daily.     rosuvastatin (CRESTOR) 20 MG tablet Take 1 tablet (20 mg total) by mouth daily. 90 tablet 3   triamcinolone lotion (KENALOG) 0.1 % SMARTSIG:Liberally Topical Twice Daily     vitamin E 600 UNIT capsule Take 1 capsule (600 Units total) by mouth daily. 100 capsule 3   YOHIMBINE HCL PO Take 1 tablet by mouth daily in the afternoon.     losartan (COZAAR) 100 MG tablet TAKE ONE TABLET BY MOUTH DAILY. Overdue for yearly appt must see MD for refills 30 tablet 0   No facility-administered medications prior to visit.    ROS: Review of Systems  Constitutional:  Negative for appetite change, fatigue and unexpected weight change.  HENT:  Negative for congestion, nosebleeds, sneezing, sore throat and trouble swallowing.   Eyes:  Negative for itching and visual disturbance.  Respiratory:  Negative for cough.   Cardiovascular:  Negative for chest pain, palpitations and leg swelling.  Gastrointestinal:  Negative for abdominal distention, blood in stool, diarrhea and nausea.  Genitourinary:  Positive for hematuria. Negative for frequency.  Musculoskeletal:  Negative for back pain, gait problem,  joint swelling and neck pain.  Skin:  Negative for rash.  Neurological:  Negative for dizziness, tremors, speech difficulty and weakness.  Psychiatric/Behavioral:  Negative for agitation, dysphoric mood and sleep disturbance. The patient is not nervous/anxious.     Objective:  BP 120/72 (BP Location: Left Arm, Patient Position: Sitting, Cuff Size: Normal)   Pulse 71   Temp (!) 97.5 F (36.4 C) (Oral)   Ht '6\' 1"'$  (1.854 m)   Wt 182 lb (82.6 kg)   SpO2 90%   BMI 24.01 kg/m   BP Readings from Last 3 Encounters:  11/18/22 120/72  08/20/22 106/86  07/17/22 (!) 142/82    Wt Readings from Last 3 Encounters:  11/18/22 182 lb (82.6 kg)  08/20/22 183 lb (83 kg)  07/17/22 183 lb (83 kg)    Physical Exam Constitutional:      General: He is not in acute distress.    Appearance: He is well-developed.     Comments: NAD  Eyes:     Conjunctiva/sclera: Conjunctivae normal.     Pupils: Pupils are equal, round, and reactive to light.  Neck:     Thyroid: No thyromegaly.     Vascular: No JVD.  Cardiovascular:     Rate and Rhythm: Normal rate and regular rhythm.     Heart sounds: Normal heart sounds. No murmur heard.    No friction rub. No gallop.  Pulmonary:  Effort: Pulmonary effort is normal. No respiratory distress.     Breath sounds: Normal breath sounds. No wheezing or rales.  Chest:     Chest wall: No tenderness.  Abdominal:     General: Bowel sounds are normal. There is no distension.     Palpations: Abdomen is soft. There is no mass.     Tenderness: There is no abdominal tenderness. There is no guarding or rebound.  Musculoskeletal:        General: No tenderness. Normal range of motion.     Cervical back: Normal range of motion.  Lymphadenopathy:     Cervical: No cervical adenopathy.  Skin:    General: Skin is warm and dry.     Findings: No rash.  Neurological:     Mental Status: He is alert and oriented to person, place, and time.     Cranial Nerves: No cranial  nerve deficit.     Motor: No abnormal muscle tone.     Coordination: Coordination normal.     Gait: Gait normal.     Deep Tendon Reflexes: Reflexes are normal and symmetric.  Psychiatric:        Behavior: Behavior normal.        Thought Content: Thought content normal.        Judgment: Judgment normal.     Lab Results  Component Value Date   WBC 6.4 06/19/2020   HGB 15.3 06/19/2020   HCT 45.5 06/19/2020   PLT 300 06/19/2020   GLUCOSE 92 06/19/2020   CHOL 189 07/11/2022   TRIG 84 07/11/2022   HDL 71 07/11/2022   LDLDIRECT 133.4 03/14/2008   LDLCALC 103 (H) 07/11/2022   ALT 34 07/11/2022   AST 26 07/11/2022   NA 137 06/19/2020   K 4.2 06/19/2020   CL 100 06/19/2020   CREATININE 0.86 06/19/2020   BUN 14 06/19/2020   CO2 25 06/19/2020   TSH 1.83 06/19/2020   PSA 0.1 06/19/2020    CT CARDIAC SCORING  Addendum Date: 08/26/2018   ADDENDUM REPORT: 08/26/2018 17:40 CLINICAL DATA:  Risk stratification EXAM: Coronary Calcium Score TECHNIQUE: The patient was scanned on a [employer] scanner. Axial non-contrast 3 mm slices were carried out through the heart. The data set was analyzed on a dedicated work station and scored using the [employer]. FINDINGS: Non-cardiac: See separate report from [facility]. Ascending Aorta: Normal size, no calcifications. Pericardium: Normal. Coronary arteries: Normal origin. IMPRESSION: Coronary calcium score of 25. This was 75 percentile for age and sex matched control. Electronically Signed   By: Ena Dawley   On: 08/26/2018 17:40   Result Date: 08/26/2018 EXAM: OVER-READ INTERPRETATION  CT CHEST The following report is an over-read performed by radiologist Dr. Rolm Baptise of [facility], [place] on 08/26/2018. This over-read does not include interpretation of cardiac or coronary anatomy or pathology. The coronary calcium score interpretation by the cardiologist is attached. COMPARISON:  None FINDINGS: Vascular: Heart is normal  size.  Visualized aorta normal caliber. Mediastinum/Nodes: No adenopathy in the lower mediastinum or hila. Lungs/Pleura: Visualized lungs clear.  No effusions. Upper Abdomen: Imaging into the upper abdomen shows no acute findings. Musculoskeletal: Chest wall soft tissues are unremarkable. No acute bony abnormality. IMPRESSION: No acute or significant extracardiac abnormality. Electronically Signed: By: Rolm Baptise M.D. On: 08/26/2018 16:46    Assessment & Plan:   Problem List Items Addressed This Visit       Cardiovascular and Mediastinum   Essential hypertension  Re-start Losartan      Relevant Medications   losartan (COZAAR) 100 MG tablet   Other Relevant Orders   Ambulatory referral to Urology   PSA   Urinalysis   Comprehensive metabolic panel   Lipid panel   Coronary atherosclerosis    2019-mild.  Coronary calcium score is 25.  On Crestor Labs at [facility]      Relevant Medications   losartan (COZAAR) 100 MG tablet   Other Relevant Orders   PSA   Urinalysis   Comprehensive metabolic panel   Lipid panel     Other   Renal colic on left side - Primary    New  Toradol po Norco po prn Pt has Flomax prn Urol ref      Relevant Orders   Ambulatory referral to Urology   PSA   Urinalysis   Comprehensive metabolic panel   Lipid panel   Dyslipidemia   Relevant Orders   PSA   Urinalysis   Comprehensive metabolic panel   Lipid panel      Meds ordered this encounter  Medications   losartan (COZAAR) 100 MG tablet    Sig: TAKE ONE TABLET BY MOUTH DAILY. Overdue for yearly appt must see MD for refills    Dispense:  90 tablet    Refill:  3   HYDROcodone-acetaminophen (NORCO) 5-325 MG tablet    Sig: Take 1 tablet by mouth every 4 (four) hours as needed for severe pain.    Dispense:  20 tablet    Refill:  0   ketorolac (TORADOL) 10 MG tablet    Sig: Take 1 tablet (10 mg total) by mouth every 8 (eight) hours.    Dispense:  21 tablet    Refill:  0       Follow-up: Return in about 6 months (around 05/19/2023) for Wellness Exam.  Walker Kehr, MD

## 2022-11-18 NOTE — Assessment & Plan Note (Signed)
2019-mild.  Coronary calcium score is 25.  On Crestor Labs at [facility]

## 2022-11-25 ENCOUNTER — Other Ambulatory Visit: Payer: Self-pay | Admitting: Internal Medicine

## 2022-11-25 NOTE — Telephone Encounter (Signed)
Controlled rx  LOV: 11/18/22 Last Fill: 09/18/22, 30 tablets with 3 refills

## 2022-12-30 ENCOUNTER — Other Ambulatory Visit: Payer: Self-pay | Admitting: Internal Medicine

## 2022-12-30 MED ORDER — FINASTERIDE 5 MG PO TABS: 5.0000 mg | ORAL_TABLET | Freq: Every day | ORAL | 1 refills | Status: DC

## 2023-02-06 ENCOUNTER — Other Ambulatory Visit: Payer: Self-pay | Admitting: Internal Medicine

## 2023-02-13 ENCOUNTER — Encounter: Payer: Self-pay | Admitting: Cardiovascular Disease

## 2023-02-13 ENCOUNTER — Other Ambulatory Visit (INDEPENDENT_AMBULATORY_CARE_PROVIDER_SITE_OTHER): Payer: BC Managed Care – PPO

## 2023-02-13 DIAGNOSIS — I2510 Atherosclerotic heart disease of native coronary artery without angina pectoris: Secondary | ICD-10-CM | POA: Diagnosis not present

## 2023-02-13 DIAGNOSIS — N23 Unspecified renal colic: Secondary | ICD-10-CM | POA: Diagnosis not present

## 2023-02-13 DIAGNOSIS — I10 Essential (primary) hypertension: Secondary | ICD-10-CM

## 2023-02-13 DIAGNOSIS — Z87442 Personal history of urinary calculi: Secondary | ICD-10-CM | POA: Diagnosis not present

## 2023-02-13 DIAGNOSIS — R310 Gross hematuria: Secondary | ICD-10-CM | POA: Diagnosis not present

## 2023-02-13 DIAGNOSIS — E785 Hyperlipidemia, unspecified: Secondary | ICD-10-CM | POA: Diagnosis not present

## 2023-02-13 DIAGNOSIS — I2583 Coronary atherosclerosis due to lipid rich plaque: Secondary | ICD-10-CM

## 2023-02-13 LAB — LIPID PANEL
Cholesterol: 191 mg/dL (ref 0–200)
HDL: 71.3 mg/dL (ref >39.00)
LDL Cholesterol: 107 mg/dL — ABNORMAL HIGH (ref 0–99)
NonHDL: 119.44
Total CHOL/HDL Ratio: 3
Triglycerides: 63 mg/dL (ref 0.0–149.0)
VLDL: 12.6 mg/dL (ref 0.0–40.0)

## 2023-02-13 LAB — COMPREHENSIVE METABOLIC PANEL
ALT: 35 U/L (ref 0–53)
AST: 27 U/L (ref 0–37)
Albumin: 4.4 g/dL (ref 3.5–5.2)
Alkaline Phosphatase: 64 U/L (ref 39–117)
BUN: 17 mg/dL (ref 6–23)
CO2: 28 mEq/L (ref 19–32)
Calcium: 9.5 mg/dL (ref 8.4–10.5)
Chloride: 103 mEq/L (ref 96–112)
Creatinine, Ser: 0.81 mg/dL (ref 0.40–1.50)
GFR: 97.76 mL/min (ref >60.00)
Glucose, Bld: 88 mg/dL (ref 70–99)
Potassium: 4.7 mEq/L (ref 3.5–5.1)
Sodium: 138 mEq/L (ref 135–145)
Total Bilirubin: 0.6 mg/dL (ref 0.2–1.2)
Total Protein: 7 g/dL (ref 6.0–8.3)

## 2023-02-13 LAB — URINALYSIS
Bilirubin Urine: NEGATIVE
Ketones, ur: NEGATIVE
Leukocytes,Ua: NEGATIVE
Nitrite: NEGATIVE
Specific Gravity, Urine: 1.015 (ref 1.000–1.030)
Total Protein, Urine: NEGATIVE
Urine Glucose: NEGATIVE
Urobilinogen, UA: 0.2 (ref 0.0–1.0)
pH: 8 (ref 5.0–8.0)

## 2023-02-13 LAB — PSA: PSA: 0.11 ng/mL (ref 0.10–4.00)

## 2023-02-13 NOTE — Telephone Encounter (Signed)
Yes. LP(a) please

## 2023-02-26 ENCOUNTER — Other Ambulatory Visit: Payer: Self-pay | Admitting: Internal Medicine

## 2023-02-27 MED ORDER — KETOROLAC TROMETHAMINE 10 MG PO TABS: 10.0000 mg | ORAL_TABLET | Freq: Three times a day (TID) | ORAL | 0 refills | Status: DC

## 2023-02-27 MED ORDER — HYDROCODONE-ACETAMINOPHEN 5-325 MG PO TABS: 1.0000 | ORAL_TABLET | ORAL | 0 refills | Status: DC | PRN

## 2023-03-13 DIAGNOSIS — R109 Unspecified abdominal pain: Secondary | ICD-10-CM | POA: Diagnosis not present

## 2023-03-13 DIAGNOSIS — I700 Atherosclerosis of aorta: Secondary | ICD-10-CM | POA: Diagnosis not present

## 2023-03-13 DIAGNOSIS — R310 Gross hematuria: Secondary | ICD-10-CM | POA: Diagnosis not present

## 2023-03-13 DIAGNOSIS — R319 Hematuria, unspecified: Secondary | ICD-10-CM | POA: Diagnosis not present

## 2023-03-13 DIAGNOSIS — N200 Calculus of kidney: Secondary | ICD-10-CM | POA: Diagnosis not present

## 2023-03-16 ENCOUNTER — Other Ambulatory Visit: Payer: Self-pay | Admitting: Internal Medicine

## 2023-03-17 MED ORDER — KETOROLAC TROMETHAMINE 10 MG PO TABS: 10.0000 mg | ORAL_TABLET | Freq: Three times a day (TID) | ORAL | 0 refills | Status: DC

## 2023-03-20 ENCOUNTER — Other Ambulatory Visit: Payer: Self-pay

## 2023-03-20 DIAGNOSIS — E785 Hyperlipidemia, unspecified: Secondary | ICD-10-CM

## 2023-03-22 LAB — LIPOPROTEIN A (LPA): Lipoprotein (a): 169.4 nmol/L — ABNORMAL HIGH (ref <75.0)

## 2023-03-23 ENCOUNTER — Encounter: Payer: Self-pay | Admitting: Cardiovascular Disease

## 2023-03-23 DIAGNOSIS — R310 Gross hematuria: Secondary | ICD-10-CM | POA: Diagnosis not present

## 2023-03-23 DIAGNOSIS — N200 Calculus of kidney: Secondary | ICD-10-CM | POA: Diagnosis not present

## 2023-03-23 DIAGNOSIS — E785 Hyperlipidemia, unspecified: Secondary | ICD-10-CM

## 2023-03-24 ENCOUNTER — Encounter (HOSPITAL_BASED_OUTPATIENT_CLINIC_OR_DEPARTMENT_OTHER): Payer: Self-pay | Admitting: Urology

## 2023-03-24 ENCOUNTER — Other Ambulatory Visit: Payer: Self-pay

## 2023-03-24 ENCOUNTER — Other Ambulatory Visit: Payer: Self-pay | Admitting: Urology

## 2023-03-24 NOTE — Progress Notes (Signed)
Spoke w/ via phone for pre-op interview---pt Lab needs dos----I stat               Lab results-----ekh 07-17-2022 epic, holter monitor 06-19-2022 epic, lov cardiology dr croitoru 08-20-2022 epic- COVID test -----patient states asymptomatic no test needed Arrive at -------630 am 03-30-2023 NPO after MN NO Solid Food.  Clear liquids from MN until---530 am Med rec completed Medications to take morning of surgery -----lorapepam prn, ketoroloac, finasteride Diabetic medication -----n/a Patient instructed no nail polish to be worn day of surgery Patient instructed to bring photo id and insurance card day of surgery Patient aware to have Driver (ride ) / caregiver    wife [name] [name] for 24 hours after surgery  Patient Special Instructions -----none Pre-Op special Instructions -----none Patient verbalized understanding of instructions that were given at this phone interview. Patient denies shortness of breath, chest pain, fever, cough at this phone interview.

## 2023-03-30 ENCOUNTER — Ambulatory Visit (HOSPITAL_BASED_OUTPATIENT_CLINIC_OR_DEPARTMENT_OTHER): Payer: BC Managed Care – PPO | Admitting: Certified Registered"

## 2023-03-30 ENCOUNTER — Ambulatory Visit (HOSPITAL_BASED_OUTPATIENT_CLINIC_OR_DEPARTMENT_OTHER)
Admission: RE | Admit: 2023-03-30 | Discharge: 2023-03-30 | Disposition: A | Discharge status: Home / Self Care | Payer: BC Managed Care – PPO | Attending: Urology | Admitting: Urology

## 2023-03-30 ENCOUNTER — Encounter (HOSPITAL_BASED_OUTPATIENT_CLINIC_OR_DEPARTMENT_OTHER): Payer: Self-pay | Admitting: Urology

## 2023-03-30 ENCOUNTER — Encounter (HOSPITAL_BASED_OUTPATIENT_CLINIC_OR_DEPARTMENT_OTHER)
Admission: RE | Disposition: A | Discharge status: Home / Self Care | Payer: Self-pay | Source: Home / Self Care | Attending: Urology

## 2023-03-30 DIAGNOSIS — I10 Essential (primary) hypertension: Secondary | ICD-10-CM | POA: Insufficient documentation

## 2023-03-30 DIAGNOSIS — N200 Calculus of kidney: Secondary | ICD-10-CM | POA: Insufficient documentation

## 2023-03-30 DIAGNOSIS — R310 Gross hematuria: Secondary | ICD-10-CM | POA: Diagnosis not present

## 2023-03-30 DIAGNOSIS — Z01818 Encounter for other preprocedural examination: Secondary | ICD-10-CM

## 2023-03-30 DIAGNOSIS — Z8249 Family history of ischemic heart disease and other diseases of the circulatory system: Secondary | ICD-10-CM | POA: Diagnosis not present

## 2023-03-30 HISTORY — PX: CYSTOSCOPY WITH RETROGRADE PYELOGRAM, URETEROSCOPY AND STENT PLACEMENT: SHX5789

## 2023-03-30 HISTORY — DX: Essential (primary) hypertension: I10

## 2023-03-30 HISTORY — PX: HOLMIUM LASER APPLICATION: SHX5852

## 2023-03-30 LAB — POCT I-STAT, CHEM 8
BUN: 15 mg/dL (ref 6–20)
Calcium, Ion: 1.23 mmol/L (ref 1.15–1.40)
Chloride: 103 mmol/L (ref 98–111)
Creatinine, Ser: 0.8 mg/dL (ref 0.61–1.24)
Glucose, Bld: 86 mg/dL (ref 70–99)
HCT: 44 % (ref 39.0–52.0)
Hemoglobin: 15 g/dL (ref 13.0–17.0)
Potassium: 3.9 mmol/L (ref 3.5–5.1)
Sodium: 139 mmol/L (ref 135–145)
TCO2: 26 mmol/L (ref 22–32)

## 2023-03-30 SURGERY — CYSTOURETEROSCOPY, WITH RETROGRADE PYELOGRAM AND STENT INSERTION
Anesthesia: General | Site: Ureter | Laterality: Left

## 2023-03-30 MED ORDER — KETOROLAC TROMETHAMINE 30 MG/ML IJ SOLN
INTRAMUSCULAR | Status: AC
  Filled 2023-03-30: qty 1

## 2023-03-30 MED ORDER — MIDAZOLAM HCL 2 MG/2ML IJ SOLN
INTRAMUSCULAR | Status: AC
  Filled 2023-03-30: qty 2

## 2023-03-30 MED ORDER — DEXMEDETOMIDINE HCL IN NACL 80 MCG/20ML IV SOLN
INTRAVENOUS | Status: AC
  Filled 2023-03-30: qty 20

## 2023-03-30 MED ORDER — DEXAMETHASONE SODIUM PHOSPHATE 10 MG/ML IJ SOLN
INTRAMUSCULAR | Status: AC
  Filled 2023-03-30: qty 1

## 2023-03-30 MED ORDER — AMISULPRIDE (ANTIEMETIC) 5 MG/2ML IV SOLN: 10.0000 mg | Freq: Once | INTRAVENOUS | Status: DC | PRN

## 2023-03-30 MED ORDER — CEFAZOLIN SODIUM-DEXTROSE 2-4 GM/100ML-% IV SOLN
2.0000 g | INTRAVENOUS | Status: AC
  Administered 2023-03-30: 2 g via INTRAVENOUS

## 2023-03-30 MED ORDER — FENTANYL CITRATE (PF) 100 MCG/2ML IJ SOLN
INTRAMUSCULAR | Status: DC | PRN
  Administered 2023-03-30: 25 ug via INTRAVENOUS
  Administered 2023-03-30: 50 ug via INTRAVENOUS
  Administered 2023-03-30: 25 ug via INTRAVENOUS

## 2023-03-30 MED ORDER — DEXAMETHASONE SODIUM PHOSPHATE 10 MG/ML IJ SOLN
INTRAMUSCULAR | Status: DC | PRN
  Administered 2023-03-30: 10 mg via INTRAVENOUS

## 2023-03-30 MED ORDER — DEXMEDETOMIDINE HCL IN NACL 80 MCG/20ML IV SOLN
INTRAVENOUS | Status: DC | PRN
  Administered 2023-03-30 (×3): 4 ug via INTRAVENOUS

## 2023-03-30 MED ORDER — OXYCODONE HCL 5 MG PO TABS
ORAL_TABLET | ORAL | Status: AC
  Filled 2023-03-30: qty 1

## 2023-03-30 MED ORDER — LIDOCAINE HCL (PF) 2 % IJ SOLN
INTRAMUSCULAR | Status: AC
  Filled 2023-03-30: qty 5

## 2023-03-30 MED ORDER — PROPOFOL 10 MG/ML IV BOLUS
INTRAVENOUS | Status: DC | PRN
  Administered 2023-03-30: 180 mg via INTRAVENOUS

## 2023-03-30 MED ORDER — CEFAZOLIN SODIUM-DEXTROSE 2-4 GM/100ML-% IV SOLN
INTRAVENOUS | Status: AC
  Filled 2023-03-30: qty 100

## 2023-03-30 MED ORDER — OXYCODONE HCL 5 MG/5ML PO SOLN: 5.0000 mg | Freq: Once | ORAL | Status: AC | PRN

## 2023-03-30 MED ORDER — ONDANSETRON HCL 4 MG/2ML IJ SOLN
INTRAMUSCULAR | Status: DC | PRN
  Administered 2023-03-30: 4 mg via INTRAVENOUS

## 2023-03-30 MED ORDER — OXYCODONE HCL 5 MG PO TABS
5.0000 mg | ORAL_TABLET | Freq: Once | ORAL | Status: AC | PRN
  Administered 2023-03-30: 5 mg via ORAL

## 2023-03-30 MED ORDER — HYDROMORPHONE HCL 1 MG/ML IJ SOLN
INTRAMUSCULAR | Status: AC
  Filled 2023-03-30: qty 1

## 2023-03-30 MED ORDER — PROPOFOL 10 MG/ML IV BOLUS
INTRAVENOUS | Status: AC
  Filled 2023-03-30: qty 20

## 2023-03-30 MED ORDER — MIDAZOLAM HCL 5 MG/5ML IJ SOLN
INTRAMUSCULAR | Status: DC | PRN
  Administered 2023-03-30: 2 mg via INTRAVENOUS

## 2023-03-30 MED ORDER — OXYBUTYNIN CHLORIDE 5 MG PO TABS: 5.0000 mg | ORAL_TABLET | Freq: Three times a day (TID) | ORAL | 0 refills | Status: DC | PRN

## 2023-03-30 MED ORDER — LIDOCAINE 2% (20 MG/ML) 5 ML SYRINGE
INTRAMUSCULAR | Status: DC | PRN
  Administered 2023-03-30: 100 mg via INTRAVENOUS

## 2023-03-30 MED ORDER — HYDROMORPHONE HCL 1 MG/ML IJ SOLN
0.2500 mg | INTRAMUSCULAR | Status: DC | PRN
  Administered 2023-03-30 (×2): 0.25 mg via INTRAVENOUS

## 2023-03-30 MED ORDER — IOHEXOL 300 MG/ML  SOLN
INTRAMUSCULAR | Status: DC | PRN
  Administered 2023-03-30: 5 mL

## 2023-03-30 MED ORDER — SODIUM CHLORIDE 0.9 % IR SOLN
Status: DC | PRN
  Administered 2023-03-30: 6000 mL

## 2023-03-30 MED ORDER — LACTATED RINGERS IV SOLN: INTRAVENOUS | Status: DC

## 2023-03-30 MED ORDER — ONDANSETRON HCL 4 MG/2ML IJ SOLN
INTRAMUSCULAR | Status: AC
  Filled 2023-03-30: qty 2

## 2023-03-30 MED ORDER — KETOROLAC TROMETHAMINE 30 MG/ML IJ SOLN
INTRAMUSCULAR | Status: DC | PRN
  Administered 2023-03-30: 30 mg via INTRAVENOUS

## 2023-03-30 MED ORDER — PROMETHAZINE HCL 25 MG/ML IJ SOLN: 6.2500 mg | INTRAMUSCULAR | Status: DC | PRN

## 2023-03-30 MED ORDER — CEPHALEXIN 500 MG PO CAPS: 500.0000 mg | ORAL_CAPSULE | Freq: Two times a day (BID) | ORAL | 0 refills | Status: AC

## 2023-03-30 MED ORDER — FENTANYL CITRATE (PF) 100 MCG/2ML IJ SOLN
INTRAMUSCULAR | Status: AC
  Filled 2023-03-30: qty 2

## 2023-03-30 SURGICAL SUPPLY — 27 items
BAG DRAIN URO-CYSTO SKYTR STRL (DRAIN) ×2 IMPLANT
BAG DRN UROCATH (DRAIN) ×1
BASKET ZERO TIP NITINOL 2.4FR (BASKET) IMPLANT
BLANKET WARM UPPER BOD BAIR (MISCELLANEOUS) ×2 IMPLANT
BSKT STON RTRVL ZERO TP 2.4FR (BASKET)
CATH URETL OPEN END 6FR 70 (CATHETERS) ×2 IMPLANT
CLOTH BEACON ORANGE TIMEOUT ST (SAFETY) ×2 IMPLANT
COVER DOME SNAP 22 D (MISCELLANEOUS) ×2 IMPLANT
DRSG TEGADERM 2-3/8X2-3/4 SM (GAUZE/BANDAGES/DRESSINGS) IMPLANT
ELECT REM PT RETURN 9FT ADLT (ELECTROSURGICAL)
ELECTRODE REM PT RTRN 9FT ADLT (ELECTROSURGICAL) IMPLANT
GLOVE BIO SURGEON STRL SZ8 (GLOVE) ×2 IMPLANT
GOWN STRL REUS W/TWL XL LVL3 (GOWN DISPOSABLE) ×2 IMPLANT
GUIDEWIRE ANG ZIPWIRE 038X150 (WIRE) IMPLANT
GUIDEWIRE STR DUAL SENSOR (WIRE) IMPLANT
IV NS IRRIG 3000ML ARTHROMATIC (IV SOLUTION) ×4 IMPLANT
KIT TURNOVER CYSTO (KITS) ×2 IMPLANT
LASER FIB FLEXIVA PULSE ID 365 (Laser) IMPLANT
MANIFOLD NEPTUNE II (INSTRUMENTS) ×2 IMPLANT
NS IRRIG 500ML POUR BTL (IV SOLUTION) ×2 IMPLANT
PACK CYSTO (CUSTOM PROCEDURE TRAY) ×2 IMPLANT
SHEATH NAVIGATOR HD 11/13X36 (SHEATH) IMPLANT
SLEEVE SCD COMPRESS KNEE MED (STOCKING) ×2 IMPLANT
TRACTIP FLEXIVA PULS ID 200XHI (Laser) IMPLANT
TRACTIP FLEXIVA PULSE ID 200 (Laser)
TUBE CONNECTING 12X1/4 (SUCTIONS) IMPLANT
TUBING UROLOGY SET (TUBING) IMPLANT

## 2023-03-30 NOTE — H&P (Signed)
H&P  Chief Complaint: Left-sided kidney stone  History of Present Illness: 57 year old male presents at this time for ureteroscopic management of an 11 mm left renal stone with symptoms.  Past Medical History:  Diagnosis Date   GERD (gastroesophageal reflux disease)    History of kidney stones 16-Apr-2014   passed on own   Hyperlipemia    Hypertension    Seborrheic dermatitis     Past Surgical History:  Procedure Laterality Date   ORCHIECTOMY  04/16/98   Nonmalignant mass; mildly elevated tumor marker preop   UPPER GASTROINTESTINAL ENDOSCOPY  04/17/2007   esophageal stricture ; Dr Arlyce Dice   VASECTOMY  2007/04/17   Dr Logan Bores    Home Medications:    Allergies: No Known Allergies  Family History  Problem Relation Age of Onset   Hyperlipidemia Mother    Heart disease Father 58       stent    Cancer Brother 47       prostate   Heart failure Maternal Grandfather    Heart failure Maternal Grandmother    Diabetes Neg Hx    Stroke Neg Hx    Depression Neg Hx    Alcohol abuse Neg Hx     Social History:  reports that he has never smoked. He has never used smokeless tobacco. He reports current alcohol use of about 10.0 standard drinks of alcohol per week. He reports that he does not use drugs.  ROS: A complete review of systems was performed.  All systems are negative except for pertinent findings as noted.  Physical Exam:  Vital signs in last 24 hours: BP 120/84   Pulse 74   Temp 98.3 F (36.8 C) (Oral)   Resp 14   Ht 6\' 1"  (1.854 m)   Wt 81.5 kg   SpO2 96%   BMI 23.70 kg/m  Constitutional:  Alert and oriented, No acute distress Cardiovascular: Regular rate  Respiratory: Normal respiratory effort Neurologic: Grossly intact, no focal deficits Psychiatric: Normal mood and affect  I have reviewed prior pt notes  I have reviewed urinalysis results  I have independently reviewed prior imaging    Impression/Assessment:  Left renal calculus  Plan:  Cystoscopy, left retrograde  ureteropyelogram, left ureteroscopy with holmium laser management of left renal stone and stent placement

## 2023-03-30 NOTE — Discharge Instructions (Addendum)
You may see some blood in the urine and may have some burning with urination for 48-72 hours. You also may notice that you have to urinate more frequently or urgently after your procedure which is normal.  You should call should you develop an inability urinate, fever > 101, persistent nausea and vomiting that prevents you from eating or drinking to stay hydrated.  If you have a stent, you will likely urinate more frequently and urgently until the stent is removed and you may experience some discomfort/pain in the lower abdomen and flank especially when urinating. You may take pain medication prescribed to you if needed for pain. You may also intermittently have blood in the urine until the stent is removed.  It is okay to remove the stent on Friday morning.  [facility] [phone] Post Ureteroscopy With or Without Stent Instructions  Definitions:  Ureter: The duct that transports urine from the kidney to the bladder. Stent:   A plastic hollow tube that is placed into the ureter, from the kidney to the bladder to prevent the ureter from swelling shut.  GENERAL INSTRUCTIONS:  Despite the fact that no skin incisions were used, the area around the ureter and bladder is raw and irritated. The stent is a foreign body which will further irritate the bladder wall. This irritation is manifested by increased frequency of urination, both day and night, and by an increase in the urge to urinate. In some, the urge to urinate is present almost always. Sometimes the urge is strong enough that you may not be able to stop yourself from urinating. The only real cure is to remove the stent and then give time for the bladder wall to heal which can't be done until the danger of the ureter swelling shut has passed, which varies.  You may see some blood in your urine while the stent is in place and a few days afterwards. Do not be alarmed, even if the urine was clear for a while. Get off your feet and  drink lots of fluids until clearing occurs. If you start to pass clots or don't improve, call us.  DIET: You may return to your normal diet immediately. Because of the raw surface of your bladder, alcohol, spicy foods, acid type foods and drinks with caffeine may cause irritation or frequency and should be used in moderation. To keep your urine flowing freely and to avoid constipation, drink plenty of fluids during the day ( 8-10 glasses ). Tip: Avoid cranberry juice because it is very acidic.  ACTIVITY: Your physical activity doesn't need to be restricted. However, if you are very active, you may see some blood in your urine. We suggest that you reduce your activity under these circumstances until the bleeding has stopped.  BOWELS: It is important to keep your bowels regular during the postoperative period. Straining with bowel movements can cause bleeding. A bowel movement every other day is reasonable. Use a mild laxative if needed, such as Milk of Magnesia 2-3 tablespoons, or 2 Dulcolax tablets. Call if you continue to have problems. If you have been taking narcotics for pain, before, during or after your surgery, you may be constipated. Take a laxative if necessary.   MEDICATION: You should resume your pre-surgery medications unless told not to. In addition you will often be given an antibiotic to prevent infection. These should be taken as prescribed until the bottles are finished unless you are having an unusual reaction to one of the drugs.  PROBLEMS  YOU SHOULD REPORT TO [place]: Fevers over 100.5 Fahrenheit. Heavy bleeding, or clots ( See above notes about blood in urine ). Inability to urinate. Drug reactions ( hives, rash, nausea, vomiting, diarrhea ). Severe burning or pain with urination that is not improving.  FOLLOW-UP: You will need a follow-up appointment to monitor your progress. Call for this appointment at the number listed above. Usually the first appointment will be about  three to fourteen days after your surgery.    No ibuprofen, Advil, Aleve, Motrin, ketorolac, meloxicam, naproxen, or other NSAIDS until after 3:30 pm today if needed.  Post Anesthesia Home Care Instructions  Activity: Get plenty of rest for the remainder of the day. A responsible individual must stay with you for 24 hours following the procedure.  For the next 24 hours, DO NOT: -Drive a car -Advertising copywriter -Drink alcoholic beverages -Take any medication unless instructed by your physician -Make any legal decisions or sign important papers.  Meals: Start with liquid foods such as gelatin or soup. Progress to regular foods as tolerated. Avoid greasy, spicy, heavy foods. If nausea and/or vomiting occur, drink only clear liquids until the nausea and/or vomiting subsides. Call your physician if vomiting continues.  Special Instructions/Symptoms: Your throat may feel dry or sore from the anesthesia or the breathing tube placed in your throat during surgery. If this causes discomfort, gargle with warm salt water. The discomfort should disappear within 24 hours.

## 2023-03-30 NOTE — Op Note (Signed)
Pre-op diagnosis: 11 mm left renal pelvic stone, history of hematuria  Postoperative diagnosis: Same  Procedure: Cystoscopy, left retrograde ureteropyelogram, fluoroscopic interpretation, left ureteroscopy with holmium laser dusting of left renal calculus, placement of 6 French by 28 cm contour double-J stent with tether  Surgeon: Cordai Rodrigue  Anesthesia: General with LMA  Complications: None  Specimen: None  Estimated blood loss: None  Indications: 57 year old initially presented with hematuria.  Also with left flank pain.  Evaluation included CT abdomen and pelvis, hematuria protocol.  This revealed normal-appearing kidneys.  11 mm left renal pelvic stone.  No other urologic abnormalities.  He presents at this time for cystoscopy to complete his hematuria evaluation as well as ureteroscopic management of his symptomatic left renal pelvic stone.  I discussed the procedure with the patient, expected risk, complications and the need for stent.  The patient understands and desires to proceed.  Findings: Urethra was normal.  Prostate was nonobstructive.  Urothelium of the bladder was normal.  There were no trabeculations.  Ureteral orifices were normal.  Retrograde study of the left ureter and pyelocalyceal system was performed utilizing a 6 [place] open-ended catheter and Omnipaque.  The ureter was normal throughout without filling defect or hydronephrosis.  Pyelocalyceal system was normal except for filling defect in the pelvis consistent with his stone.  Description of procedure: Patient properly identified and marked in the holding area.  He received preoperative IV antibiotics.  He is taken to the operating room where general anesthetic was administered with the LMA.  He is placed in the dorsolithotomy position.  Genitalia and perineum were prepped, draped, proper timeout performed.  21 French panendoscope advanced into his bladder.  Circumferential inspection was performed with the  above-mentioned findings noted.  Retrograde study of the left ureter and pyelocalyceal system performed with the 6 [place] open-ended catheter and Omnipaque with the above-mentioned findings noted.  Following this, sensor tip guidewire was advanced through the open-ended catheter with a curl seen eventually in the upper pole calyceal system.  Open-ended catheter was removed.  Ureter was sequentially dilated first with the inner core then the entire 11/13 medium length ureteral access catheter.  This was done without difficulty.  A safety wire was left in.  I then negotiated the dual-lumen flexible digital ureteroscope easily into the pyelocalyceal system.  Only 1 solitary stone was present.  I used a 360 m fiber to dust the stone.  Settings of both 0.3 J and 53 Hz as well as 0.8 J and 53 Hz were utilized to transfer the stone from 1 solitary stone to countless small dust like fragments.  I inspected each calyx following dusting.  No significant sized stone fragments remained.  At this point, the scope was backed out.  The access catheter was also removed.  I then backloaded the safety wire through the cystoscope.  A 28 cm x 6 French contour double-J stent with tether left on was then placed.  After being deployed with the guidewire removed, excellent proximal and distal curls were seen utilizing fluoroscopy and cystoscopy, respectively.  The bladder was drained.  The scope was removed.  The tether was tied in a knot outside the meatus, trimmed, and taped to the penis.  At this point the procedure was terminated.  The patient was awakened, taken to the PACU in stable condition having tolerated the procedure well.

## 2023-03-30 NOTE — Anesthesia Procedure Notes (Signed)
Procedure Name: LMA Insertion Date/Time: 03/30/2023 8:44 AM  Performed by: Faryal Marxen D, CRNAPre-anesthesia Checklist: Patient identified, Emergency Drugs available, Suction available and Patient being monitored Patient Re-evaluated:Patient Re-evaluated prior to induction Oxygen Delivery Method: Circle system utilized Preoxygenation: Pre-oxygenation with 100% oxygen Induction Type: IV induction Ventilation: Mask ventilation without difficulty LMA: LMA inserted LMA Size: 5.0 Tube type: Oral Number of attempts: 1 Placement Confirmation: positive ETCO2 and breath sounds checked- equal and bilateral Tube secured with: Tape Dental Injury: Teeth and Oropharynx as per pre-operative assessment

## 2023-03-30 NOTE — Transfer of Care (Signed)
Immediate Anesthesia Transfer of Care Note  Patient: AMARIOUS BERST  Procedure(s) Performed: CYSTOSCOPY WITH RETROGRADE PYELOGRAM, URETEROSCOPY AND STENT PLACEMENT (Left: Ureter) HOLMIUM LASER APPLICATION (Left: Ureter)  Patient Location: PACU  Anesthesia Type:General  Level of Consciousness: awake, alert , and oriented  Airway & Oxygen Therapy: Patient Spontanous Breathing and Patient connected to nasal cannula oxygen  Post-op Assessment: Report given to RN and Post -op Vital signs reviewed and stable  Post vital signs: Reviewed and stable  Last Vitals:  Vitals Value Taken Time  BP 127/86 03/30/23 0932  Temp 36.5 C 03/30/23 0932  Pulse 83 03/30/23 0933  Resp 11 03/30/23 0933  SpO2 100 % 03/30/23 0933  Vitals shown include unvalidated device data.  Last Pain:  Vitals:   03/30/23 0705  TempSrc: Oral  PainSc: 0-No pain      Patients Stated Pain Goal: 6 (03/30/23 0705)  Complications: No notable events documented.

## 2023-03-30 NOTE — Anesthesia Preprocedure Evaluation (Signed)
Anesthesia Evaluation  Patient identified by MRN, date of birth, ID band Patient awake    Reviewed: Allergy & Precautions, H&P , NPO status , Patient's Chart, lab work & pertinent test results  Airway Mallampati: II  TM Distance: >3 FB Neck ROM: Full    Dental no notable dental hx.    Pulmonary neg pulmonary ROS   Pulmonary exam normal breath sounds clear to auscultation       Cardiovascular hypertension, Pt. on medications Normal cardiovascular exam Rhythm:Regular Rate:Normal     Neuro/Psych negative neurological ROS  negative psych ROS   GI/Hepatic Neg liver ROS,GERD  ,,  Endo/Other  negative endocrine ROS    Renal/GU negative Renal ROS  negative genitourinary   Musculoskeletal negative musculoskeletal ROS (+)    Abdominal   Peds negative pediatric ROS (+)  Hematology negative hematology ROS (+)   Anesthesia Other Findings   Reproductive/Obstetrics negative OB ROS                             Anesthesia Physical Anesthesia Plan  ASA: 2  Anesthesia Plan: General   Post-op Pain Management: Dilaudid IV   Induction: Intravenous  PONV Risk Score and Plan: 2 and Ondansetron, Midazolam and Treatment may vary due to age or medical condition  Airway Management Planned: LMA  Additional Equipment:   Intra-op Plan:   Post-operative Plan: Extubation in OR  Informed Consent: I have reviewed the patients History and Physical, chart, labs and discussed the procedure including the risks, benefits and alternatives for the proposed anesthesia with the patient or authorized representative who has indicated his/her understanding and acceptance.     Dental advisory given  Plan Discussed with: CRNA  Anesthesia Plan Comments:        Anesthesia Quick Evaluation

## 2023-03-30 NOTE — Anesthesia Postprocedure Evaluation (Signed)
Anesthesia Post Note  Patient: [name] [name]  Procedure(s) Performed: CYSTOSCOPY WITH RETROGRADE PYELOGRAM, URETEROSCOPY AND STENT PLACEMENT (Left: Ureter) HOLMIUM LASER APPLICATION (Left: Ureter)     Patient location during evaluation: PACU Anesthesia Type: General Level of consciousness: awake and alert Pain management: pain level controlled Vital Signs Assessment: post-procedure vital signs reviewed and stable Respiratory status: spontaneous breathing, nonlabored ventilation and respiratory function stable Cardiovascular status: blood pressure returned to baseline and stable Postop Assessment: no apparent nausea or vomiting Anesthetic complications: no   No notable events documented.  Last Vitals:  Vitals:   03/30/23 1015 03/30/23 1121  BP: 113/79 119/72  Pulse: 67 62  Resp: 17 14  Temp:    SpO2: 94% 96%    Last Pain:  Vitals:   03/30/23 1037  TempSrc:   PainSc: 5                  Lowella Curb

## 2023-03-31 ENCOUNTER — Encounter (HOSPITAL_BASED_OUTPATIENT_CLINIC_OR_DEPARTMENT_OTHER): Payer: Self-pay | Admitting: Urology

## 2023-04-13 DIAGNOSIS — N200 Calculus of kidney: Secondary | ICD-10-CM | POA: Diagnosis not present

## 2023-04-13 DIAGNOSIS — R310 Gross hematuria: Secondary | ICD-10-CM | POA: Diagnosis not present

## 2023-05-04 ENCOUNTER — Other Ambulatory Visit: Payer: Self-pay | Admitting: Internal Medicine

## 2023-05-12 ENCOUNTER — Other Ambulatory Visit (HOSPITAL_COMMUNITY): Payer: Self-pay

## 2023-05-12 ENCOUNTER — Encounter (HOSPITAL_BASED_OUTPATIENT_CLINIC_OR_DEPARTMENT_OTHER): Payer: Self-pay | Admitting: Internal Medicine

## 2023-05-12 ENCOUNTER — Telehealth: Payer: Self-pay

## 2023-05-12 ENCOUNTER — Ambulatory Visit (INDEPENDENT_AMBULATORY_CARE_PROVIDER_SITE_OTHER): Payer: BC Managed Care – PPO | Admitting: Internal Medicine

## 2023-05-12 VITALS — BP 147/83 | HR 83 | Ht 73.0 in | Wt 181.8 lb

## 2023-05-12 DIAGNOSIS — E785 Hyperlipidemia, unspecified: Secondary | ICD-10-CM | POA: Diagnosis not present

## 2023-05-12 DIAGNOSIS — E7841 Elevated Lipoprotein(a): Secondary | ICD-10-CM

## 2023-05-12 DIAGNOSIS — I700 Atherosclerosis of aorta: Secondary | ICD-10-CM

## 2023-05-12 DIAGNOSIS — R931 Abnormal findings on diagnostic imaging of heart and coronary circulation: Secondary | ICD-10-CM

## 2023-05-12 DIAGNOSIS — Z8249 Family history of ischemic heart disease and other diseases of the circulatory system: Secondary | ICD-10-CM

## 2023-05-12 NOTE — Patient Instructions (Signed)
Medication Instructions:  Dr. Rennis Golden recommends Repatha Sureclick 140mg /mL (PCSK9). This is an injectable cholesterol medication self-administered once every 14 days. This medication will likely need prior approval with your insurance company, which we will work on. If the medication is not approved initially, we may need to do an appeal with your insurance.   Administer medication in area of fatty tissue such as abdomen, outer thigh, back of upper arm - and rotate site with each injection Store medication in refrigerator until ready to administer - allow to sit at room temp for 30 mins - 1 hour prior to injection Dispose of medication in a SHARPS container - your pharmacy should be able to direct you on this and proper disposal   If you need a co-pay card for Repatha: Lawsponsor.fr If you need a co-pay card for Praluent: [email or web address]  Patient Assistance:    These foundations have funds at various times.   The [employer] Foundation: https://www.panfoundation.org/disease-funds/hypercholesterolemia/ -- can sign up for wait list  The [facility] offers assistance to help pay for medication copays.  They will cover copays for all cholesterol lowering meds, including statins, fibrates, omega-3 fish oils like Vascepa, ezetimibe, Repatha, Praluent, Nexletol, Nexlizet.  The cards are usually good for $2,500 or 12 months, whichever comes first. Our fax # is [phone] (you will need this to apply) Go to healthwellfoundation.org Click on "Apply Now" Answer questions as to whom is applying (patient or representative) Your disease fund will be "hypercholesterolemia - Medicare access" They will ask questions about finances and which medications you are taking for cholesterol When you submit, the approval is usually within minutes.  You will need to print the card information from the site You will need to show this information to your pharmacy,  they will bill your Medicare Part D plan first -then bill Health Well --for the copay.   You can also call them at [phone], although the hold times can be quite long.     *If you need a refill on your cardiac medications before your next appointment, please call your pharmacy*   Lab Work: FASTING lab work to check cholesterol in 3-4 months  ** complete ONE WEEK before next visit  If you have labs (blood work) drawn today and your tests are completely normal, you will receive your results only by: MyChart Message (if you have MyChart) OR A paper copy in the mail If you have any lab test that is abnormal or we need to change your treatment, we will call you to review the results.   Follow-Up: At [facility], you and your health needs are our priority.  As part of our continuing mission to provide you with exceptional heart care, we have created designated Provider Care Teams.  These Care Teams include your primary Cardiologist (physician) and Advanced Practice Providers (APPs -  Physician Assistants and Nurse Practitioners) who all work together to provide you with the care you need, when you need it.  We recommend signing up for the patient portal called "MyChart".  Sign up information is provided on this After Visit Summary.  MyChart is used to connect with patients for Virtual Visits (Telemedicine).  Patients are able to view lab/test results, encounter notes, upcoming appointments, etc.  Non-urgent messages can be sent to your provider as well.   To learn more about what you can do with MyChart, go to [email or web address].    Your next appointment:    3-4 months with Dr. Rennis Golden - lipid clinic

## 2023-05-12 NOTE — Progress Notes (Signed)
LIPID CLINIC CONSULT NOTE  Chief Complaint:  Manage dyslipidemia  Primary Care Physician: Tresa Garter, MD  Primary Cardiologist:  Thurmon Fair, MD  HPI:  [name] [name] is a 57 y.o. male who is being seen today for the evaluation of dyslipidemia at the request of Croitoru, Rachelle Hora, MD. this is a pleasant 57 year old male who was kindly referred for evaluation management of dyslipidemia, specifically elevated LP(a).  He underwent calcium scoring back in 05/17/2018 which showed a calcium score of 25, 68th percentile for age and sex matched controls.  He also has aortic atherosclerosis.  He has been on statin therapy, with a recent increase in his rosuvastatin, now on 20 mg daily.  His cholesterol earlier this year showed total 191, triglycerides 63, HDL 71 and LDL 107.  He subsequently had LP(a) testing which was elevated at 169.4 nmol/L.  Based on that alternative therapies were discussed and he was referred for evaluation in the lipid clinic.  There is unfortunately early onset heart disease in his father who had a stent in his 63s.  There is dyslipidemia in the family as well.  He says he eats a pretty healthy diet low in saturated fat and is physically active.  PMHx:  Past Medical History:  Diagnosis Date   GERD (gastroesophageal reflux disease)    History of kidney stones 05-17-14   passed on own   Hyperlipemia    Hypertension    Seborrheic dermatitis     Past Surgical History:  Procedure Laterality Date   CYSTOSCOPY WITH RETROGRADE PYELOGRAM, URETEROSCOPY AND STENT PLACEMENT Left 03/30/2023   Procedure: CYSTOSCOPY WITH RETROGRADE PYELOGRAM, URETEROSCOPY AND STENT PLACEMENT;  Surgeon: Marcine Matar, MD;  Location: [facility];  Service: Urology;  Laterality: Left;  90 MINS   HOLMIUM LASER APPLICATION Left 03/30/2023   Procedure: HOLMIUM LASER APPLICATION;  Surgeon: Marcine Matar, MD;  Location: [facility];  Service: Urology;  Laterality:  Left;   ORCHIECTOMY  1999   Nonmalignant mass; mildly elevated tumor marker preop   UPPER GASTROINTESTINAL ENDOSCOPY  05-18-2007   esophageal stricture ; Dr Arlyce Dice   VASECTOMY  2007-05-18   Dr Logan Bores    FAMHx:  Family History  Problem Relation Age of Onset   Hyperlipidemia Mother    Heart disease Father 80       stent    Cancer Brother 73       prostate   Heart failure Maternal Grandfather    Heart failure Maternal Grandmother    Diabetes Neg Hx    Stroke Neg Hx    Depression Neg Hx    Alcohol abuse Neg Hx     SOCHx:   reports that he has never smoked. He has never used smokeless tobacco. He reports current alcohol use of about 10.0 standard drinks of alcohol per week. He reports that he does not use drugs.  ALLERGIES:  No Known Allergies  ROS: Pertinent items noted in HPI and remainder of comprehensive ROS otherwise negative.  HOME MEDS: Current Outpatient Medications on File Prior to Visit  Medication Sig Dispense Refill   Coenzyme Q10 (CO Q10) 100 MG CAPS Take 100 mg by mouth daily in the afternoon.     finasteride (PROSCAR) 5 MG tablet Take 1 tablet (5 mg total) by mouth daily. 90 tablet 1   LORazepam (ATIVAN) 1 MG tablet TAKE ONE TABLET TWICE DAILY AS NEEDED FOR ANXIETY 30 tablet 1   losartan (COZAAR) 100 MG tablet TAKE ONE TABLET  BY MOUTH DAILY. Overdue for yearly appt must see MD for refills 90 tablet 3   Multiple Vitamin (MULTIVITAMIN) tablet Take 1 tablet by mouth daily.     rosuvastatin (CRESTOR) 20 MG tablet Take 1 tablet (20 mg total) by mouth daily. (Patient taking differently: Take 20 mg by mouth at bedtime.) 90 tablet 3   triamcinolone lotion (KENALOG) 0.1 %      vitamin E 600 UNIT capsule Take 1 capsule (600 Units total) by mouth daily. 100 capsule 3   YOHIMBINE HCL PO Take 1 tablet by mouth daily before breakfast.     No current facility-administered medications on file prior to visit.    LABS/IMAGING: No results found for this or any previous visit (from the  past 48 hour(s)). No results found.  LIPID PANEL:    Component Value Date/Time   CHOL 191 02/13/2023 0917   CHOL 189 07/11/2022 1108   TRIG 63.0 02/13/2023 0917   HDL 71.30 02/13/2023 0917   HDL 71 07/11/2022 1108   CHOLHDL 3 02/13/2023 0917   VLDL 12.6 02/13/2023 0917   LDLCALC 107 (H) 02/13/2023 0917   LDLCALC 103 (H) 07/11/2022 1108   LDLCALC 161 (H) 06/19/2020 1038   LDLDIRECT 133.4 03/14/2008 0932    WEIGHTS: Wt Readings from Last 3 Encounters:  05/12/23 181 lb 12.8 oz (82.5 kg)  03/30/23 179 lb 9.6 oz (81.5 kg)  11/18/22 182 lb (82.6 kg)    VITALS: BP (!) 147/83 (BP Location: Left Arm, Patient Position: Sitting, Cuff Size: Normal)   Pulse 83   Ht 6\' 1"  (1.854 m)   Wt 181 lb 12.8 oz (82.5 kg)   SpO2 97%   BMI 23.99 kg/m   EXAM: Deferred  EKG: Deferred  ASSESSMENT: Mixed dyslipidemia, goal LDL less than 70 Elevated CAC score of 25, 68th percentile for age and sex matched control (2019) Aortic atherosclerosis First-degree relative with early onset heart disease Elevated LP(a) at 169.4 nmol/L  PLAN: 1.   [name] [name] is on high intensity rosuvastatin however remains above target LDL less than 70.  He has coronary calcification which was age advanced based in 2019 but this has not been reassessed.  He also had aortic atherosclerosis and early onset heart disease in his father.  I agree with additional LDL lowering therapy and he is a good candidate to add PCSK9 inhibitor.  In addition he was found to have an elevated LP(a) which would be beneficial as typically we see 20 to 30% reduction in LP(a) on PCSK9 inhibitor therapy.  We just gust proceeding with Repatha today and he is interested.  Will reach out for prior authorization and plan to repeat lipid NMR and LP(a) in about 3 to 4 months.  Follow-up with me afterwards.  Thanks as always for the kind referral.  Chrystie Nose, MD, Milagros Loll  [place]  [facility]  Medical Director of the Advanced Lipid  Disorders &  Cardiovascular Risk Reduction Clinic Diplomate of the American Board of Clinical Lipidology Attending Cardiologist  Direct Dial: [phone]  Fax: [phone]  Website:  [email or web address]  Lisette Abu Jeaneen Cala 05/12/2023, 3:12 PM

## 2023-05-12 NOTE — Telephone Encounter (Signed)
Pharmacy Patient Advocate Encounter   Received notification from [facility] that prior authorization for REPATHA 140 MG/ML INJ is needed.    PA submitted on 05/12/23 Key [id number] Status is pending  Haze Rushing, CPhT Pharmacy Patient Advocate Specialist Direct Number: [phone] Fax: [phone]

## 2023-05-15 MED ORDER — REPATHA SURECLICK 140 MG/ML ~~LOC~~ SOAJ: 140.0000 mg | SUBCUTANEOUS | 3 refills | Status: DC

## 2023-05-15 NOTE — Telephone Encounter (Signed)
Pharmacy Patient Advocate Encounter  Prior Authorization for REPATHA has been approved.    Effective dates: 05/12/23-08/10/23  Haze Rushing, CPhT Pharmacy Patient Advocate Specialist Direct Number: [phone] Fax: [phone]

## 2023-05-15 NOTE — Addendum Note (Signed)
Addended by: Lindell Spar on: 05/15/2023 11:59 AM   Modules accepted: Orders

## 2023-06-08 ENCOUNTER — Encounter: Payer: Self-pay | Admitting: Internal Medicine

## 2023-06-08 ENCOUNTER — Other Ambulatory Visit: Payer: Self-pay | Admitting: Internal Medicine

## 2023-06-26 ENCOUNTER — Other Ambulatory Visit: Payer: Self-pay | Admitting: Internal Medicine

## 2023-07-14 ENCOUNTER — Other Ambulatory Visit: Payer: Self-pay | Admitting: Internal Medicine

## 2023-07-15 ENCOUNTER — Encounter: Payer: Self-pay | Admitting: Internal Medicine

## 2023-08-02 ENCOUNTER — Other Ambulatory Visit: Payer: Self-pay | Admitting: Nurse Practitioner

## 2023-08-03 ENCOUNTER — Encounter: Payer: Self-pay | Admitting: Internal Medicine

## 2023-08-03 ENCOUNTER — Ambulatory Visit: Payer: BC Managed Care – PPO | Admitting: Internal Medicine

## 2023-08-03 VITALS — BP 118/80 | HR 78 | Temp 98.3°F | Ht 73.0 in | Wt 182.0 lb

## 2023-08-03 DIAGNOSIS — I2510 Atherosclerotic heart disease of native coronary artery without angina pectoris: Secondary | ICD-10-CM | POA: Diagnosis not present

## 2023-08-03 DIAGNOSIS — Z1211 Encounter for screening for malignant neoplasm of colon: Secondary | ICD-10-CM | POA: Diagnosis not present

## 2023-08-03 DIAGNOSIS — I2583 Coronary atherosclerosis due to lipid rich plaque: Secondary | ICD-10-CM

## 2023-08-03 DIAGNOSIS — E785 Hyperlipidemia, unspecified: Secondary | ICD-10-CM | POA: Diagnosis not present

## 2023-08-03 DIAGNOSIS — R0683 Snoring: Secondary | ICD-10-CM

## 2023-08-03 DIAGNOSIS — Z0000 Encounter for general adult medical examination without abnormal findings: Secondary | ICD-10-CM

## 2023-08-03 DIAGNOSIS — R1012 Left upper quadrant pain: Secondary | ICD-10-CM | POA: Insufficient documentation

## 2023-08-03 MED ORDER — LOSARTAN POTASSIUM 100 MG PO TABS: ORAL_TABLET | ORAL | 3 refills | Status: DC

## 2023-08-03 MED ORDER — LORAZEPAM 1 MG PO TABS: ORAL_TABLET | ORAL | 2 refills | Status: DC

## 2023-08-03 NOTE — Assessment & Plan Note (Signed)
Coronary calcium score of 25. This was 10 percentile for age and sex On Crestor and Repatha

## 2023-08-03 NOTE — Assessment & Plan Note (Signed)
New.  Unclear etiology.  Probably musculoskeletal.  [name] [name] recently had abdominal CT renal protocol study.  He will try to get the report.  If the pain continues-will obtain abdominal ultrasound.  [name] [name] will let me know.

## 2023-08-03 NOTE — Progress Notes (Signed)
Subjective:  Patient ID: [name] [name], male    DOB: [birth year]  Age: 57 y.o. MRN: [id number]  CC: Follow-up   HPI [name] [name] presents for CAD, dyslipidemia,   C/o LLQ abd pain x 1 week, annoying pain, no other accompanying symptoms  Complaining of loud snoring  Outpatient Medications Prior to Visit  Medication Sig Dispense Refill   Coenzyme Q10 (CO Q10) 100 MG CAPS Take 100 mg by mouth daily in the afternoon.     Evolocumab (REPATHA SURECLICK) 140 MG/ML SOAJ Inject 140 mg into the skin every 14 (fourteen) days. 6 mL 3   finasteride (PROSCAR) 5 MG tablet Take 1 tablet (5 mg total) by mouth daily. 90 tablet 1   Multiple Vitamin (MULTIVITAMIN) tablet Take 1 tablet by mouth daily.     rosuvastatin (CRESTOR) 20 MG tablet Take 1 tablet (20 mg total) by mouth daily. 90 tablet 3   triamcinolone lotion (KENALOG) 0.1 %      vitamin E 600 UNIT capsule Take 1 capsule (600 Units total) by mouth daily. 100 capsule 3   YOHIMBINE HCL PO Take 1 tablet by mouth daily before breakfast.     LORazepam (ATIVAN) 1 MG tablet take one tablet by mouth twice daily as needed for anxiety 30 tablet 0   losartan (COZAAR) 100 MG tablet TAKE ONE TABLET BY MOUTH DAILY. Overdue for yearly appt must see MD for refills 90 tablet 3   No facility-administered medications prior to visit.    ROS: Review of Systems  Constitutional:  Negative for appetite change, fatigue and unexpected weight change.  HENT:  Negative for congestion, nosebleeds, sneezing, sore throat and trouble swallowing.   Eyes:  Negative for itching and visual disturbance.  Respiratory:  Negative for cough.   Cardiovascular:  Negative for chest pain, palpitations and leg swelling.  Gastrointestinal:  Positive for abdominal pain. Negative for abdominal distention, blood in stool, diarrhea and nausea.  Genitourinary:  Negative for frequency and hematuria.  Musculoskeletal:  Negative for back pain, gait problem, joint swelling and neck pain.  Skin:   Negative for rash.  Neurological:  Negative for dizziness, tremors, speech difficulty and weakness.  Psychiatric/Behavioral:  Negative for agitation, dysphoric mood and sleep disturbance. The patient is not nervous/anxious.     Objective:  BP 118/80 (BP Location: Right Arm, Patient Position: Sitting, Cuff Size: Normal)   Pulse 78   Temp 98.3 F (36.8 C) (Oral)   Ht 6\' 1"  (1.854 m)   Wt 182 lb (82.6 kg)   SpO2 98%   BMI 24.01 kg/m   BP Readings from Last 3 Encounters:  08/03/23 118/80  05/12/23 (!) 147/83  03/30/23 119/72    Wt Readings from Last 3 Encounters:  08/03/23 182 lb (82.6 kg)  05/12/23 181 lb 12.8 oz (82.5 kg)  03/30/23 179 lb 9.6 oz (81.5 kg)    Physical Exam Constitutional:      General: He is not in acute distress.    Appearance: Normal appearance. He is well-developed.     Comments: NAD  Eyes:     Conjunctiva/sclera: Conjunctivae normal.     Pupils: Pupils are equal, round, and reactive to light.  Neck:     Thyroid: No thyromegaly.     Vascular: No JVD.  Cardiovascular:     Rate and Rhythm: Normal rate and regular rhythm.     Heart sounds: Normal heart sounds. No murmur heard.    No friction rub. No gallop.  Pulmonary:  Effort: Pulmonary effort is normal. No respiratory distress.     Breath sounds: Normal breath sounds. No wheezing or rales.  Chest:     Chest wall: No tenderness.  Abdominal:     General: Bowel sounds are normal. There is no distension.     Palpations: Abdomen is soft. There is no mass.     Tenderness: There is no abdominal tenderness. There is no guarding or rebound.  Musculoskeletal:        General: No tenderness. Normal range of motion.     Cervical back: Normal range of motion.  Lymphadenopathy:     Cervical: No cervical adenopathy.  Skin:    General: Skin is warm and dry.     Findings: No rash.  Neurological:     Mental Status: He is alert and oriented to person, place, and time.     Cranial Nerves: No cranial nerve  deficit.     Motor: No abnormal muscle tone.     Coordination: Coordination normal.     Gait: Gait normal.     Deep Tendon Reflexes: Reflexes are normal and symmetric.  Psychiatric:        Behavior: Behavior normal.        Thought Content: Thought content normal.        Judgment: Judgment normal.   Abdomen soft and nontender No hernia or organomegaly felt  Lab Results  Component Value Date   WBC 6.4 06/19/2020   HGB 15.0 03/30/2023   HCT 44.0 03/30/2023   PLT 300 06/19/2020   GLUCOSE 86 03/30/2023   CHOL 191 02/13/2023   TRIG 63.0 02/13/2023   HDL 71.30 02/13/2023   LDLDIRECT 133.4 03/14/2008   LDLCALC 107 (H) 02/13/2023   ALT 35 02/13/2023   AST 27 02/13/2023   NA 139 03/30/2023   K 3.9 03/30/2023   CL 103 03/30/2023   CREATININE 0.80 03/30/2023   BUN 15 03/30/2023   CO2 28 02/13/2023   TSH 1.83 06/19/2020   PSA 0.11 02/13/2023    No results found.  Assessment & Plan:   Problem List Items Addressed This Visit     Dyslipidemia    Coronary calcium score of 25. This was 69 percentile for age and sex On Crestor and Repatha      Relevant Orders   CBC with Differential/Platelet   Comprehensive metabolic panel   TSH   Urinalysis   Well adult exam    Coronary calcium score of 25. This was 34 percentile for age and sex On Crestor and Repatha      Snoring - Primary    Sleep consult      Relevant Orders   Ambulatory referral to Pulmonology   Coronary atherosclerosis    2019-mild.  Coronary calcium score is 25.  On Crestor/ Repatha       Relevant Medications   losartan (COZAAR) 100 MG tablet   Left upper quadrant abdominal pain    New.  Unclear etiology.  Probably musculoskeletal.  [name] [name] recently had abdominal CT renal protocol study.  He will try to get the report.  If the pain continues-will obtain abdominal ultrasound.  [name] [name] will let me know.      Other Visit Diagnoses     Screening for colon cancer       Relevant Orders   Ambulatory referral  to Gastroenterology   LUQ pain       Relevant Orders   CBC with Differential/Platelet   Comprehensive metabolic panel   TSH  Urinalysis         Meds ordered this encounter  Medications   LORazepam (ATIVAN) 1 MG tablet    Sig: take one tablet by mouth twice daily as needed for anxiety    Dispense:  30 tablet    Refill:  2    Schedule office visit with me in August or September   losartan (COZAAR) 100 MG tablet    Sig: TAKE ONE TABLET BY MOUTH DAILY. Overdue for yearly appt must see MD for refills    Dispense:  90 tablet    Refill:  3      Follow-up: Return in about 6 months (around 01/31/2024) for Wellness Exam.  Sonda Primes, MD

## 2023-08-03 NOTE — Assessment & Plan Note (Signed)
2019-mild.  Coronary calcium score is 25.  On Crestor/ Repatha

## 2023-08-03 NOTE — Assessment & Plan Note (Signed)
Sleep consult

## 2023-08-14 ENCOUNTER — Other Ambulatory Visit: Payer: Self-pay | Admitting: Internal Medicine

## 2023-08-14 ENCOUNTER — Encounter (HOSPITAL_BASED_OUTPATIENT_CLINIC_OR_DEPARTMENT_OTHER): Payer: Self-pay | Admitting: Internal Medicine

## 2023-08-14 DIAGNOSIS — Z1211 Encounter for screening for malignant neoplasm of colon: Secondary | ICD-10-CM

## 2023-08-14 DIAGNOSIS — Z1212 Encounter for screening for malignant neoplasm of rectum: Secondary | ICD-10-CM

## 2023-08-14 NOTE — Telephone Encounter (Signed)
Sending to prior auth for Repatha

## 2023-08-17 ENCOUNTER — Telehealth: Payer: Self-pay | Admitting: Pharmacy Technician

## 2023-08-17 ENCOUNTER — Other Ambulatory Visit (HOSPITAL_COMMUNITY): Payer: Self-pay

## 2023-08-17 NOTE — Telephone Encounter (Addendum)
Pharmacy Patient Advocate Encounter   Received notification from Patient Advice Request messages that prior authorization for repatha is required/requested.   Insurance verification completed.   The patient is insured through [name] .   Per test claim: PA required; PA submitted to [facility] via CoverMyMeds Key/confirmation #/EOC [id number] Status is pending

## 2023-08-24 ENCOUNTER — Other Ambulatory Visit (HOSPITAL_COMMUNITY): Payer: Self-pay

## 2023-08-25 ENCOUNTER — Other Ambulatory Visit (HOSPITAL_COMMUNITY): Payer: Self-pay

## 2023-08-28 NOTE — Telephone Encounter (Signed)
Message sent to patient about lab work needed for PA

## 2023-08-28 NOTE — Telephone Encounter (Signed)
Pharmacy Patient Advocate Encounter  Received notification from [facility]  that Prior Authorization for repatha has been DENIED.  Full denial letter will be uploaded to the media tab. See denial reason below.    PA #/Case ID/Reference #: [id number]

## 2023-08-31 ENCOUNTER — Ambulatory Visit (AMBULATORY_SURGERY_CENTER): Payer: BC Managed Care – PPO

## 2023-08-31 ENCOUNTER — Other Ambulatory Visit (INDEPENDENT_AMBULATORY_CARE_PROVIDER_SITE_OTHER): Payer: BC Managed Care – PPO

## 2023-08-31 VITALS — Ht 73.0 in | Wt 180.0 lb

## 2023-08-31 DIAGNOSIS — R1012 Left upper quadrant pain: Secondary | ICD-10-CM | POA: Diagnosis not present

## 2023-08-31 DIAGNOSIS — R931 Abnormal findings on diagnostic imaging of heart and coronary circulation: Secondary | ICD-10-CM | POA: Diagnosis not present

## 2023-08-31 DIAGNOSIS — E785 Hyperlipidemia, unspecified: Secondary | ICD-10-CM | POA: Diagnosis not present

## 2023-08-31 DIAGNOSIS — E7841 Elevated Lipoprotein(a): Secondary | ICD-10-CM | POA: Diagnosis not present

## 2023-08-31 DIAGNOSIS — I700 Atherosclerosis of aorta: Secondary | ICD-10-CM | POA: Diagnosis not present

## 2023-08-31 DIAGNOSIS — Z1211 Encounter for screening for malignant neoplasm of colon: Secondary | ICD-10-CM

## 2023-08-31 LAB — COMPREHENSIVE METABOLIC PANEL
ALT: 34 U/L (ref 0–53)
AST: 25 U/L (ref 0–37)
Albumin: 4.2 g/dL (ref 3.5–5.2)
Alkaline Phosphatase: 62 U/L (ref 39–117)
BUN: 20 mg/dL (ref 6–23)
CO2: 28 meq/L (ref 19–32)
Calcium: 9.2 mg/dL (ref 8.4–10.5)
Chloride: 102 meq/L (ref 96–112)
Creatinine, Ser: 0.87 mg/dL (ref 0.40–1.50)
GFR: 95.3 mL/min (ref >60.00)
Glucose, Bld: 93 mg/dL (ref 70–99)
Potassium: 3.9 meq/L (ref 3.5–5.1)
Sodium: 138 meq/L (ref 135–145)
Total Bilirubin: 0.9 mg/dL (ref 0.2–1.2)
Total Protein: 7.3 g/dL (ref 6.0–8.3)

## 2023-08-31 LAB — CBC WITH DIFFERENTIAL/PLATELET
Basophils Absolute: 0 10*3/uL (ref 0.0–0.1)
Basophils Relative: 0.6 % (ref 0.0–3.0)
Eosinophils Absolute: 0.2 10*3/uL (ref 0.0–0.7)
Eosinophils Relative: 2.7 % (ref 0.0–5.0)
HCT: 44.7 % (ref 39.0–52.0)
Hemoglobin: 14.6 g/dL (ref 13.0–17.0)
Lymphocytes Relative: 29.9 % (ref 12.0–46.0)
Lymphs Abs: 1.7 10*3/uL (ref 0.7–4.0)
MCHC: 32.7 g/dL (ref 30.0–36.0)
MCV: 94.2 fl (ref 78.0–100.0)
Monocytes Absolute: 0.6 10*3/uL (ref 0.1–1.0)
Monocytes Relative: 10.1 % (ref 3.0–12.0)
Neutro Abs: 3.3 10*3/uL (ref 1.4–7.7)
Neutrophils Relative %: 56.7 % (ref 43.0–77.0)
Platelets: 299 10*3/uL (ref 150.0–400.0)
RBC: 4.74 Mil/uL (ref 4.22–5.81)
RDW: 13.3 % (ref 11.5–15.5)
WBC: 5.8 10*3/uL (ref 4.0–10.5)

## 2023-08-31 LAB — TSH: TSH: 1.69 u[IU]/mL (ref 0.35–5.50)

## 2023-08-31 LAB — URINALYSIS
Bilirubin Urine: NEGATIVE
Ketones, ur: NEGATIVE
Leukocytes,Ua: NEGATIVE
Nitrite: NEGATIVE
Specific Gravity, Urine: 1.02 (ref 1.000–1.030)
Total Protein, Urine: NEGATIVE
Urine Glucose: NEGATIVE
Urobilinogen, UA: 0.2 (ref 0.0–1.0)
pH: 7 (ref 5.0–8.0)

## 2023-08-31 NOTE — Progress Notes (Signed)
No egg or soy allergy known to patient  No issues known to pt with past sedation with any surgeries or procedures Patient denies ever being told they had issues or difficulty with intubation  No FH of Malignant Hyperthermia Pt is not on diet pills Pt is not on  home 02  Pt is not on blood thinners  Pt denies issues with constipation  No A fib or A flutter Have any cardiac testing pending--no  LOA: independent  Prep: Spilt dose Miralax   Patient's chart reviewed by [name] [name] CNRA prior to previsit and patient appropriate for the [facility].  Previsit completed and red dot placed by patient's name on their procedure day (on provider's schedule).     PV competed with patient. Prep instructions sent via mychart and home address.

## 2023-09-01 LAB — NMR, LIPOPROFILE
Cholesterol, Total: 189 mg/dL (ref 100–199)
HDL Particle Number: 44.8 umol/L (ref >=30.5)
HDL-C: 71 mg/dL (ref >39)
LDL Particle Number: 1027 nmol/L — ABNORMAL HIGH (ref <1000)
LDL Size: 21.4 nmol (ref >20.5)
LDL-C (NIH Calc): 101 mg/dL — ABNORMAL HIGH (ref 0–99)
LP-IR Score: 31 (ref <=45)
Small LDL Particle Number: 445 nmol/L (ref <=527)
Triglycerides: 98 mg/dL (ref 0–149)

## 2023-09-01 LAB — LIPOPROTEIN A (LPA): Lipoprotein (a): 117.9 nmol/L — ABNORMAL HIGH (ref <75.0)

## 2023-09-01 NOTE — Progress Notes (Unsigned)
Dear Lorin Picket, Your labs/tests are stable.. Sincerely, AP  Disclaimer: This note was dictated with voice recognition software. Similar sounding words can inadvertently be transcribed and may not be corrected upon review.

## 2023-09-02 ENCOUNTER — Telehealth: Payer: Self-pay | Admitting: Pharmacy Technician

## 2023-09-02 NOTE — Telephone Encounter (Signed)
Pharmacy Patient Advocate Encounter   Received notification from Pt Calls Messages that prior authorization for repatha is required/requested.   Insurance verification completed.   The patient is insured through [facility] .   Per test claim: PA required; PA submitted to [facility] via CoverMyMeds Key/confirmation #/EOC [id number] Status is pending

## 2023-09-04 ENCOUNTER — Other Ambulatory Visit (HOSPITAL_COMMUNITY): Payer: Self-pay

## 2023-09-07 ENCOUNTER — Other Ambulatory Visit (HOSPITAL_COMMUNITY): Payer: Self-pay

## 2023-09-07 NOTE — Telephone Encounter (Signed)
Pharmacy Patient Advocate Encounter  Received notification from [facility] that Prior Authorization for repatha has been APPROVED from 09/07/23 to 09/05/24   PA #/Case ID/Reference #: [id number]

## 2023-09-09 ENCOUNTER — Encounter (HOSPITAL_BASED_OUTPATIENT_CLINIC_OR_DEPARTMENT_OTHER): Payer: Self-pay | Admitting: Internal Medicine

## 2023-09-15 ENCOUNTER — Telehealth (INDEPENDENT_AMBULATORY_CARE_PROVIDER_SITE_OTHER): Payer: BC Managed Care – PPO | Admitting: Internal Medicine

## 2023-09-15 ENCOUNTER — Encounter (HOSPITAL_BASED_OUTPATIENT_CLINIC_OR_DEPARTMENT_OTHER): Payer: Self-pay | Admitting: Internal Medicine

## 2023-09-15 VITALS — BP 118/90 | HR 78 | Ht 73.0 in | Wt 180.0 lb

## 2023-09-15 DIAGNOSIS — E782 Mixed hyperlipidemia: Secondary | ICD-10-CM | POA: Diagnosis not present

## 2023-09-15 DIAGNOSIS — E785 Hyperlipidemia, unspecified: Secondary | ICD-10-CM

## 2023-09-15 DIAGNOSIS — I700 Atherosclerosis of aorta: Secondary | ICD-10-CM

## 2023-09-15 DIAGNOSIS — E7841 Elevated Lipoprotein(a): Secondary | ICD-10-CM | POA: Diagnosis not present

## 2023-09-15 DIAGNOSIS — R931 Abnormal findings on diagnostic imaging of heart and coronary circulation: Secondary | ICD-10-CM | POA: Diagnosis not present

## 2023-09-15 NOTE — Patient Instructions (Signed)
Medication Instructions:  NO CHANGES  *If you need a refill on your cardiac medications before your next appointment, please call your pharmacy*   Lab Work: FASTING lab work in 6 months to check cholesterol If you have labs (blood work) drawn today and your tests are completely normal, you will receive your results only by: MyChart Message (if you have MyChart) OR A paper copy in the mail If you have any lab test that is abnormal or we need to change your treatment, we will call you to review the results.   Follow-Up: At [facility], you and your health needs are our priority.  As part of our continuing mission to provide you with exceptional heart care, we have created designated Provider Care Teams.  These Care Teams include your primary Cardiologist (physician) and Advanced Practice Providers (APPs -  Physician Assistants and Nurse Practitioners) who all work together to provide you with the care you need, when you need it.  We recommend signing up for the patient portal called "MyChart".  Sign up information is provided on this After Visit Summary.  MyChart is used to connect with patients for Virtual Visits (Telemedicine).  Patients are able to view lab/test results, encounter notes, upcoming appointments, etc.  Non-urgent messages can be sent to your provider as well.   To learn more about what you can do with MyChart, go to [email or web address].    Your next appointment:    6 months with Dr. Rennis Golden

## 2023-09-15 NOTE — Progress Notes (Signed)
Virtual Visit via Video Note   Because of [name] [name]'s co-morbid illnesses, he is at least at moderate risk for complications without adequate follow up.  This format is felt to be most appropriate for this patient at this time.  All issues noted in this document were discussed and addressed.  A limited physical exam was performed with this format.  Please refer to the patient's chart for his consent to telehealth for [facility].      Date:  09/15/2023   ID:  [name] [name], DOB 15-Dec-1965, MRN [id number] The patient was identified using 2 identifiers.  Evaluation Performed:  Follow-Up Visit  Patient Location:  [address] [place] [address]  Provider location:   [address], [address] [place], [place] [address]  PCP:  Tresa Garter, MD  Cardiologist:  Thurmon Fair, MD Electrophysiologist:  None   Chief Complaint: Follow-up dyslipidemia  History of Present Illness:    [name] [name] is a 57 y.o. male who presents via audio/video conferencing for a telehealth visit today.  this is a pleasant male who was kindly referred for evaluation management of dyslipidemia, specifically elevated LP(a).  He underwent calcium scoring back in 2019 which showed a calcium score of 25, 68th percentile for age and sex matched controls.  He also has aortic atherosclerosis.  He has been on statin therapy, with a recent increase in his rosuvastatin, now on 20 mg daily.  His cholesterol earlier this year showed total 191, triglycerides 63, HDL 71 and LDL 107.  He subsequently had LP(a) testing which was elevated at 169.4 nmol/L.  Based on that alternative therapies were discussed and he was referred for evaluation in the lipid clinic.  There is unfortunately early onset heart disease in his father who had a stent in his 87s.  There is dyslipidemia in the family as well.  He says he eats a pretty healthy diet low in saturated fat and is physically active.  09/15/2023  [name] [name] is seen today in follow-up for dyslipidemia via virtual visit.  He has been on Repatha and seems to be tolerating it well.  His LDL does not come down substantially now at 101, down from 107 in April but his particle numbers 1027, HDL 71 and triglycerides 98.  His small LDL particle number is normal at 445.  He seems to be tolerating combination therapy with statin and the PCSK9 inhibitor well.  Moreover, he has had a reduction in his LP(a) from 169.4 nmol/L to 117.9 nmol/L.  Prior CV studies:   The following studies were reviewed today:  Chart reviewed, lab work  PMHx:  Past Medical History:  Diagnosis Date   GERD (gastroesophageal reflux disease)    History of kidney stones 2015   passed on own   Hyperlipemia    Hypertension    Seborrheic dermatitis     Past Surgical History:  Procedure Laterality Date   CYSTOSCOPY WITH RETROGRADE PYELOGRAM, URETEROSCOPY AND STENT PLACEMENT Left 03/30/2023   Procedure: CYSTOSCOPY WITH RETROGRADE PYELOGRAM, URETEROSCOPY AND STENT PLACEMENT;  Surgeon: Marcine Matar, MD;  Location: [facility];  Service: Urology;  Laterality: Left;  90 MINS   HOLMIUM LASER APPLICATION Left 03/30/2023   Procedure: HOLMIUM LASER APPLICATION;  Surgeon: Marcine Matar, MD;  Location: [facility];  Service: Urology;  Laterality: Left;   ORCHIECTOMY  1999   Nonmalignant mass; mildly elevated tumor marker preop   UPPER GASTROINTESTINAL ENDOSCOPY  2008   esophageal  stricture ; Dr Arlyce Dice   VASECTOMY  2008   Dr Logan Bores    FAMHx:  Family History  Problem Relation Age of Onset   Hyperlipidemia Mother    Heart disease Father 21       stent    Cancer Brother 51       prostate   Heart failure Maternal Grandmother    Heart failure Maternal Grandfather    Diabetes Neg Hx    Stroke Neg Hx    Depression Neg Hx    Alcohol abuse Neg Hx    Colon cancer Neg Hx    Stomach cancer Neg Hx    Rectal cancer Neg Hx    Esophageal cancer Neg  Hx    Colon polyps Neg Hx     SOCHx:   reports that he has never smoked. He has never used smokeless tobacco. He reports current alcohol use of about 10.0 standard drinks of alcohol per week. He reports that he does not use drugs.  ALLERGIES:  No Known Allergies  MEDS:  Current Meds  Medication Sig   Coenzyme Q10 (CO Q10) 100 MG CAPS Take 100 mg by mouth daily in the afternoon.   Evolocumab (REPATHA SURECLICK) 140 MG/ML SOAJ Inject 140 mg into the skin every 14 (fourteen) days.   finasteride (PROSCAR) 5 MG tablet Take 1 tablet (5 mg total) by mouth daily.   LORazepam (ATIVAN) 1 MG tablet take one tablet by mouth twice daily as needed for anxiety   losartan (COZAAR) 100 MG tablet TAKE ONE TABLET BY MOUTH DAILY. Overdue for yearly appt must see MD for refills   Multiple Vitamin (MULTIVITAMIN) tablet Take 1 tablet by mouth daily.   rosuvastatin (CRESTOR) 20 MG tablet Take 1 tablet (20 mg total) by mouth daily.   triamcinolone lotion (KENALOG) 0.1 %    vitamin E 600 UNIT capsule Take 1 capsule (600 Units total) by mouth daily.   YOHIMBINE HCL PO Take 1 tablet by mouth daily before breakfast.     ROS: Pertinent items noted in HPI and remainder of comprehensive ROS otherwise negative.  Labs/Other Tests and Data Reviewed:    Recent Labs: 08/31/2023: ALT 34; BUN 20; Creatinine, Ser 0.87; Hemoglobin 14.6; Platelets 299.0; Potassium 3.9; Sodium 138; TSH 1.69   Recent Lipid Panel Lab Results  Component Value Date/Time   CHOL 191 02/13/2023 09:17 AM   CHOL 189 07/11/2022 11:08 AM   TRIG 63.0 02/13/2023 09:17 AM   HDL 71.30 02/13/2023 09:17 AM   HDL 71 07/11/2022 11:08 AM   CHOLHDL 3 02/13/2023 09:17 AM   LDLCALC 107 (H) 02/13/2023 09:17 AM   LDLCALC 103 (H) 07/11/2022 11:08 AM   LDLCALC 161 (H) 06/19/2020 10:38 AM   LDLDIRECT 133.4 03/14/2008 09:32 AM    Wt Readings from Last 3 Encounters:  09/15/23 180 lb (81.6 kg)  08/31/23 180 lb (81.6 kg)  08/03/23 182 lb (82.6 kg)      Exam:    Vital Signs:  BP (!) 118/90   Pulse 78   Ht 6\' 1"  (1.854 m)   Wt 180 lb (81.6 kg)   BMI 23.75 kg/m    General appearance: alert and no distress Lungs: No visual respiratory difficulty Abdomen: Normal weight Extremities: extremities normal, atraumatic, no cyanosis or edema Neurologic: Grossly normal  ASSESSMENT & PLAN:    Mixed dyslipidemia, goal LDL less than 70 Elevated CAC score of 25, 68th percentile for age and sex matched control (2019) Aortic atherosclerosis First-degree relative with early  onset heart disease Elevated LP(a) at 169.4 nmol/L  Mr. Gervasi is doing well with the reduction in LP(a) as well as minimal reduction in LDL cholesterol.  His particle numbers are mildly elevated.  Overall good response to therapy.  I would advise continuing his current combination therapy.  He will continue to work on diet and lifestyle measures and we will repeat lipids in 6 months.   Time:   Today, I have spent 15 minutes with the patient with telehealth technology discussing dyslipidemia.     Medication Adjustments/Labs and Tests Ordered: Current medicines are reviewed at length with the patient today.  Concerns regarding medicines are outlined above.   Tests Ordered: Orders Placed This Encounter  Procedures   NMR, lipoprofile    Medication Changes: No orders of the defined types were placed in this encounter.   Disposition:  in 6 month(s)  Chrystie Nose, MD, [facility], FACP    [facility]  Medical Director of the Advanced Lipid Disorders &  Cardiovascular Risk Reduction Clinic Diplomate of the American Board of Clinical Lipidology Attending Cardiologist  Direct Dial: [phone]  Fax: [phone]  Website:  [email or web address]  Chrystie Nose, MD  09/15/2023 9:41 AM

## 2023-09-17 ENCOUNTER — Encounter: Payer: Self-pay | Admitting: Internal Medicine

## 2023-09-21 ENCOUNTER — Other Ambulatory Visit: Payer: Self-pay | Admitting: Internal Medicine

## 2023-09-24 NOTE — Progress Notes (Signed)
[facility] History and Physical   Primary Care Physician:  Plotnikov, Georgina Quint, MD   Reason for Procedure:  Colon cancer screening  Plan:    Colonoscopy     HPI: [name] [name] is a 57 y.o. male presenting for a screening colonoscopy exam.   Past Medical History:  Diagnosis Date   GERD (gastroesophageal reflux disease)    History of kidney stones 10-19-2014   passed on own   Hyperlipemia    Hypertension    Seborrheic dermatitis     Past Surgical History:  Procedure Laterality Date   CYSTOSCOPY WITH RETROGRADE PYELOGRAM, URETEROSCOPY AND STENT PLACEMENT Left 03/30/2023   Procedure: CYSTOSCOPY WITH RETROGRADE PYELOGRAM, URETEROSCOPY AND STENT PLACEMENT;  Surgeon: Marcine Matar, MD;  Location: [facility];  Service: Urology;  Laterality: Left;  90 MINS   HOLMIUM LASER APPLICATION Left 03/30/2023   Procedure: HOLMIUM LASER APPLICATION;  Surgeon: Marcine Matar, MD;  Location: [facility];  Service: Urology;  Laterality: Left;   ORCHIECTOMY  1999   Nonmalignant mass; mildly elevated tumor marker preop   UPPER GASTROINTESTINAL ENDOSCOPY  10/20/2007   esophageal stricture ; Dr Arlyce Dice   VASECTOMY  10-20-07   Dr Logan Bores    Prior to Admission medications   Medication Sig Start Date End Date Taking? Authorizing Provider  Coenzyme Q10 (CO Q10) 100 MG CAPS Take 100 mg by mouth daily in the afternoon.    [provider]  Evolocumab (REPATHA SURECLICK) 140 MG/ML SOAJ Inject 140 mg into the skin every 14 (fourteen) days. 05/15/23   Hilty, Lisette Abu, MD  finasteride (PROSCAR) 5 MG tablet Take 1 tablet (5 mg total) by mouth daily. 12/30/22   Plotnikov, Georgina Quint, MD  LORazepam (ATIVAN) 1 MG tablet take one tablet by mouth twice daily as needed for anxiety 09/21/23   Plotnikov, Georgina Quint, MD  losartan (COZAAR) 100 MG tablet TAKE ONE TABLET BY MOUTH DAILY. Overdue for yearly appt must see MD for refills 08/03/23   Plotnikov, Georgina Quint, MD  Multiple  Vitamin (MULTIVITAMIN) tablet Take 1 tablet by mouth daily.    [provider]  rosuvastatin (CRESTOR) 20 MG tablet Take 1 tablet (20 mg total) by mouth daily. 08/03/23   Joylene Grapes, NP  triamcinolone lotion (KENALOG) 0.1 %  10/29/22   [provider]  vitamin E 600 UNIT capsule Take 1 capsule (600 Units total) by mouth daily. 06/19/20   Plotnikov, Georgina Quint, MD  YOHIMBINE HCL PO Take 1 tablet by mouth daily before breakfast.    [provider]    Current Outpatient Medications  Medication Sig Dispense Refill   Coenzyme Q10 (CO Q10) 100 MG CAPS Take 100 mg by mouth daily in the afternoon.     finasteride (PROSCAR) 5 MG tablet Take 1 tablet (5 mg total) by mouth daily. 90 tablet 1   LORazepam (ATIVAN) 1 MG tablet take one tablet by mouth twice daily as needed for anxiety 30 tablet 2   losartan (COZAAR) 100 MG tablet TAKE ONE TABLET BY MOUTH DAILY. Overdue for yearly appt must see MD for refills 90 tablet 3   Multiple Vitamin (MULTIVITAMIN) tablet Take 1 tablet by mouth daily.     rosuvastatin (CRESTOR) 20 MG tablet Take 1 tablet (20 mg total) by mouth daily. 90 tablet 3   vitamin E 600 UNIT capsule Take 1 capsule (600 Units total) by mouth daily. 100 capsule 3   YOHIMBINE HCL PO Take 1 tablet by mouth daily  before breakfast.     Evolocumab (REPATHA SURECLICK) 140 MG/ML SOAJ Inject 140 mg into the skin every 14 (fourteen) days. 6 mL 3   triamcinolone lotion (KENALOG) 0.1 %      Current Facility-Administered Medications  Medication Dose Route Frequency Provider Last Rate Last Admin   0.9 %  sodium chloride infusion  500 mL Intravenous Once Iva Boop, MD        Allergies as of 09/25/2023   (No Known Allergies)    Family History  Problem Relation Age of Onset   Hyperlipidemia Mother    Heart disease Father 104       stent    Cancer Brother 26       prostate   Heart failure Maternal Grandmother    Heart failure Maternal Grandfather    Diabetes Neg Hx     Stroke Neg Hx    Depression Neg Hx    Alcohol abuse Neg Hx    Colon cancer Neg Hx    Stomach cancer Neg Hx    Rectal cancer Neg Hx    Esophageal cancer Neg Hx    Colon polyps Neg Hx     Social History   Socioeconomic History   Marital status: Married    Spouse name: Not on file   Number of children: Not on file   Years of education: Not on file   Highest education level: Professional school degree (e.g., MD, DDS, DVM, JD)  Occupational History   Not on file  Tobacco Use   Smoking status: Never   Smokeless tobacco: Never  Vaping Use   Vaping status: Never Used  Substance and Sexual Activity   Alcohol use: Yes    Alcohol/week: 10.0 standard drinks of alcohol    Types: 10 Glasses of wine per week    Comment: socially   Drug use: No   Sexual activity: Yes  Other Topics Concern   Not on file  Social History Narrative   Not on file   Social Determinants of Health   Financial Resource Strain: Low Risk  (07/30/2023)   Overall Financial Resource Strain (CARDIA)    Difficulty of Paying Living Expenses: Not hard at all  Food Insecurity: No Food Insecurity (07/30/2023)   Hunger Vital Sign    Worried About Running Out of Food in the Last Year: Never true    Ran Out of Food in the Last Year: Never true  Transportation Needs: No Transportation Needs (07/30/2023)   PRAPARE - Administrator, Civil Service (Medical): No    Lack of Transportation (Non-Medical): No  Physical Activity: Sufficiently Active (07/30/2023)   Exercise Vital Sign    Days of Exercise per Week: 3 days    Minutes of Exercise per Session: 60 min  Stress: No Stress Concern Present (07/30/2023)   [employer] of Occupational Health - Occupational Stress Questionnaire    Feeling of Stress : Only a little  Social Connections: Socially Integrated (07/30/2023)   Social Connection and Isolation Panel [NHANES]    Frequency of Communication with Friends and Family: More than three times a week     Frequency of Social Gatherings with Friends and Family: Once a week    Attends Religious Services: 1 to 4 times per year    Active Member of [employer] or Organizations: Yes    Attends Banker Meetings: 1 to 4 times per year    Marital Status: Married  Catering manager Violence: Not on file  Review of Systems:  All other review of systems negative except as mentioned in the HPI.  Physical Exam: Vital signs BP 115/75   Pulse 98   Temp 98.6 F (37 C)   Ht 6\' 1"  (1.854 m)   Wt 180 lb (81.6 kg)   SpO2 100%   BMI 23.75 kg/m   General:   Alert,  Well-developed, well-nourished, pleasant and cooperative in NAD Lungs:  Clear throughout to auscultation.   Heart:  Regular rate and rhythm; no murmurs, clicks, rubs,  or gallops. Abdomen:  Soft, nontender and nondistended. Normal bowel sounds.   Neuro/Psych:  Alert and cooperative. Normal mood and affect. A and O x 3   @Timara Loma  Sena Slate, MD, [facility] [phone] (pager) 09/25/2023 3:03 PM@

## 2023-09-25 ENCOUNTER — Ambulatory Visit: Payer: BC Managed Care – PPO | Admitting: Internal Medicine

## 2023-09-25 ENCOUNTER — Encounter: Payer: Self-pay | Admitting: Internal Medicine

## 2023-09-25 VITALS — BP 122/82 | HR 74 | Temp 98.6°F | Resp 13 | Ht 73.0 in | Wt 180.0 lb

## 2023-09-25 DIAGNOSIS — D127 Benign neoplasm of rectosigmoid junction: Secondary | ICD-10-CM

## 2023-09-25 DIAGNOSIS — Z1211 Encounter for screening for malignant neoplasm of colon: Secondary | ICD-10-CM

## 2023-09-25 MED ORDER — SODIUM CHLORIDE 0.9 % IV SOLN
500.0000 mL | Freq: Once | INTRAVENOUS | Status: DC
Start: 2023-09-25 — End: 2023-09-25

## 2023-09-25 NOTE — Op Note (Signed)
[facility] Patient Name: [name] [name] Procedure Date: 09/25/2023 3:07 PM MRN: [id number] Endoscopist: Iva Boop , MD, [id number] Age: 57 Referring MD:  Date of Birth: 1966-03-08 Gender: Male Account #: [id number] Procedure:                Colonoscopy Indications:              Screening for colorectal malignant neoplasm Medicines:                Monitored Anesthesia Care Procedure:                Pre-Anesthesia Assessment:                           - Prior to the procedure, a History and Physical                            was performed, and patient medications and                            allergies were reviewed. The patient's tolerance of                            previous anesthesia was also reviewed. The risks                            and benefits of the procedure and the sedation                            options and risks were discussed with the patient.                            All questions were answered, and informed consent                            was obtained. Prior Anticoagulants: The patient has                            taken no anticoagulant or antiplatelet agents. ASA                            Grade Assessment: II - A patient with mild systemic                            disease. After reviewing the risks and benefits,                            the patient was deemed in satisfactory condition to                            undergo the procedure.                           After obtaining informed consent, the colonoscope  was passed under direct vision. Throughout the                            procedure, the patient's blood pressure, pulse, and                            oxygen saturations were monitored continuously. The                            CF [id number] #[id number] was introduced through the anus                            and advanced to the the cecum, identified by                            appendiceal orifice  and ileocecal valve. The                            colonoscopy was performed without difficulty. The                            patient tolerated the procedure well. The quality                            of the bowel preparation was good. The ileocecal                            valve, appendiceal orifice, and rectum were                            photographed. The bowel preparation used was                            Miralax via split dose instruction. Scope In: 3:14:06 PM Scope Out: 3:28:39 PM Scope Withdrawal Time: 0 hours 10 minutes 19 seconds  Total Procedure Duration: 0 hours 14 minutes 33 seconds  Findings:                 The perianal and digital rectal examinations were                            normal. Pertinent negatives include normal prostate                            (size, shape, and consistency).                           A 12 mm polyp was found in the recto-sigmoid colon.                            The polyp was pedunculated. The polyp was removed                            with a hot snare. Resection and retrieval were  complete. Verification of patient identification                            for the specimen was done. Estimated blood loss:                            none.                           Multiple diverticula were found in the left colon.                           The exam was otherwise without abnormality on                            direct and retroflexion views. Complications:            No immediate complications. Estimated Blood Loss:     Estimated blood loss: none. Impression:               - One 12 mm polyp at the recto-sigmoid colon,                            removed with a hot snare. Resected and retrieved.                           - Diverticulosis in the left colon.                           - The examination was otherwise normal on direct                            and retroflexion views. Recommendation:            - Patient has a contact number available for                            emergencies. The signs and symptoms of potential                            delayed complications were discussed with the                            patient. Return to normal activities tomorrow.                            Written discharge instructions were provided to the                            patient.                           - Resume previous diet.                           - Continue present medications.                           -  No aspirin, ibuprofen, naproxen, or other                            non-steroidal anti-inflammatory drugs for 2 weeks                            after polyp removal. Iva Boop, MD 09/25/2023 3:34:42 PM This report has been signed electronically.

## 2023-09-25 NOTE — Patient Instructions (Addendum)
I found and removed one polyp today. Looks benign. I will let you know pathology results and when to have another routine colonoscopy by mail and/or My Chart.  You also have a condition called diverticulosis - common and not usually a problem. Please read the handout provided.  I appreciate the opportunity to care for you. Iva Boop, MD, FACG   YOU HAD AN ENDOSCOPIC PROCEDURE TODAY AT THE [facility]:   Refer to the procedure report that was given to you for any specific questions about what was found during the examination.  If the procedure report does not answer your questions, please call your gastroenterologist to clarify.  If you requested that your care partner not be given the details of your procedure findings, then the procedure report has been included in a sealed envelope for you to review at your convenience later.  YOU SHOULD EXPECT: Some feelings of bloating in the abdomen. Passage of more gas than usual.  Walking can help get rid of the air that was put into your GI tract during the procedure and reduce the bloating. If you had a lower endoscopy (such as a colonoscopy or flexible sigmoidoscopy) you may notice spotting of blood in your stool or on the toilet paper. If you underwent a bowel prep for your procedure, you may not have a normal bowel movement for a few days.  Please Note:  You might notice some irritation and congestion in your nose or some drainage.  This is from the oxygen used during your procedure.  There is no need for concern and it should clear up in a day or so.  SYMPTOMS TO REPORT IMMEDIATELY:  Following lower endoscopy (colonoscopy or flexible sigmoidoscopy):  Excessive amounts of blood in the stool  Significant tenderness or worsening of abdominal pains  Swelling of the abdomen that is new, acute  Fever of 100F or higher  For urgent or emergent issues, a gastroenterologist can be reached at any hour by calling [phone]. Do not  use MyChart messaging for urgent concerns.    DIET:  We do recommend a small meal at first, but then you may proceed to your regular diet.  Drink plenty of fluids but you should avoid alcoholic beverages for 24 hours.  ACTIVITY:  You should plan to take it easy for the rest of today and you should NOT DRIVE or use heavy machinery until tomorrow (because of the sedation medicines used during the test).    FOLLOW UP: Our staff will call the number listed on your records the next business day following your procedure.  We will call around 7:15- 8:00 am to check on you and address any questions or concerns that you may have regarding the information given to you following your procedure. If we do not reach you, we will leave a message.     If any biopsies were taken you will be contacted by phone or by letter within the next 1-3 weeks.  Please call us at [phone] if you have not heard about the biopsies in 3 weeks.    SIGNATURES/CONFIDENTIALITY: You and/or your care partner have signed paperwork which will be entered into your electronic medical record.  These signatures attest to the fact that that the information above on your After Visit Summary has been reviewed and is understood.  Full responsibility of the confidentiality of this discharge information lies with you and/or your care-partner.

## 2023-09-25 NOTE — Progress Notes (Signed)
Pt's states no medical or surgical changes since previsit or office visit. 

## 2023-09-25 NOTE — Progress Notes (Signed)
Vss nad trans to pacu 

## 2023-09-25 NOTE — Progress Notes (Signed)
Called to room to assist during endoscopic procedure.  Patient ID and intended procedure confirmed with present staff. Received instructions for my participation in the procedure from the performing physician.  

## 2023-09-28 ENCOUNTER — Telehealth: Payer: Self-pay

## 2023-09-28 NOTE — Telephone Encounter (Signed)
No answer, left message to call if having any issues or concerns, B.Lj Miyamoto RN 

## 2023-09-30 LAB — SURGICAL PATHOLOGY

## 2023-10-02 ENCOUNTER — Encounter: Payer: Self-pay | Admitting: Internal Medicine

## 2023-10-02 DIAGNOSIS — Z860101 Personal history of adenomatous and serrated colon polyps: Secondary | ICD-10-CM | POA: Insufficient documentation

## 2023-10-02 HISTORY — DX: Personal history of adenomatous and serrated colon polyps: Z86.0101

## 2023-11-12 ENCOUNTER — Other Ambulatory Visit: Payer: Self-pay | Admitting: Internal Medicine

## 2023-11-16 ENCOUNTER — Encounter (HOSPITAL_BASED_OUTPATIENT_CLINIC_OR_DEPARTMENT_OTHER): Payer: Self-pay | Admitting: Pulmonary Disease

## 2023-11-16 ENCOUNTER — Ambulatory Visit (HOSPITAL_BASED_OUTPATIENT_CLINIC_OR_DEPARTMENT_OTHER): Payer: BC Managed Care – PPO | Admitting: Pulmonary Disease

## 2023-11-16 VITALS — BP 112/84 | HR 92 | Resp 16 | Ht 73.0 in | Wt 179.1 lb

## 2023-11-16 DIAGNOSIS — R0683 Snoring: Secondary | ICD-10-CM

## 2023-11-16 DIAGNOSIS — E785 Hyperlipidemia, unspecified: Secondary | ICD-10-CM | POA: Diagnosis not present

## 2023-11-16 DIAGNOSIS — I10 Essential (primary) hypertension: Secondary | ICD-10-CM

## 2023-11-16 DIAGNOSIS — G4733 Obstructive sleep apnea (adult) (pediatric): Secondary | ICD-10-CM

## 2023-11-16 NOTE — Patient Instructions (Signed)
 Home sleep test  Based on this, we will get you an autoCPAP machine

## 2023-11-16 NOTE — Progress Notes (Signed)
 Subjective:    Patient ID: [name] [name], male    DOB: 14-Mar-1966, 58 y.o.   MRN: [id number]  HPI  Discussed the use of AI scribe software for clinical note transcription with the patient, who gave verbal consent to proceed.  History of Present Illness   [name] [name], a patient with a history of hypertension controlled with losartan , presents with a long-standing issue of snoring that has been causing distress to his spouse. The snoring is so severe that he has resorted to sleeping in separate bedrooms. The patient reports that he sleeps well and does not experience any symptoms of sleep apnea such as daytime sleepiness or fatigue. He has tried various remedies in the past, including over-the-counter mouthpieces, nasal devices, and even laser treatments, but none have been successful. The patient is currently taking lorazepam  1mg  at night for sleep and a statin, which he also takes at night. He also uses yohimbine to counteract the sexual side effects of the statin. The patient has a family history of heart conditions and is on a low dose of losartan  for blood pressure control.     He denies daytime sleepiness.  Epworth sleepiness score is 2. Bedtime is 10 PM, sleep latency about 30 minutes, he sleeps on his side with 1 pillow, reports 1-2 nocturnal awakenings including nocturia and is out of bed at 6:30 AM feeling rested without dryness of mouth or headache.    Past Medical History:  Diagnosis Date   GERD (gastroesophageal reflux disease)    History of kidney stones 29-Oct-2014   passed on own   Hx of adenomatous polyp of colon 10/02/2023   09/2023 12 mm TV adenoma recall 10/29/2026    Hyperlipemia    Hypertension    Seborrheic dermatitis    Past Surgical History:  Procedure Laterality Date   CYSTOSCOPY WITH RETROGRADE PYELOGRAM, URETEROSCOPY AND STENT PLACEMENT Left 03/30/2023   Procedure: CYSTOSCOPY WITH RETROGRADE PYELOGRAM, URETEROSCOPY AND STENT PLACEMENT;  Surgeon: Matilda Senior, MD;   Location: [facility];  Service: Urology;  Laterality: Left;  90 MINS   HOLMIUM LASER APPLICATION Left 03/30/2023   Procedure: HOLMIUM LASER APPLICATION;  Surgeon: Matilda Senior, MD;  Location: [facility];  Service: Urology;  Laterality: Left;   ORCHIECTOMY  1999   Nonmalignant mass; mildly elevated tumor marker preop   UPPER GASTROINTESTINAL ENDOSCOPY  10/30/2007   esophageal stricture ; Dr Debrah   VASECTOMY  Oct 30, 2007   Dr Janit    No Known Allergies Social History   Socioeconomic History   Marital status: Married    Spouse name: Not on file   Number of children: Not on file   Years of education: Not on file   Highest education level: Professional school degree (e.g., MD, DDS, DVM, JD)  Occupational History   Not on file  Tobacco Use   Smoking status: Never    Passive exposure: Never   Smokeless tobacco: Never  Vaping Use   Vaping status: Never Used  Substance and Sexual Activity   Alcohol use: Yes    Alcohol/week: 10.0 standard drinks of alcohol    Types: 10 Glasses of wine per week    Comment: socially   Drug use: No   Sexual activity: Yes  Other Topics Concern   Not on file  Social History Narrative   Not on file   Social Drivers of Health   Financial Resource Strain: Low Risk  (07/30/2023)   Overall Financial Resource Strain (CARDIA)  Difficulty of Paying Living Expenses: Not hard at all  Food Insecurity: No Food Insecurity (07/30/2023)   Hunger Vital Sign    Worried About Running Out of Food in the Last Year: Never true    Ran Out of Food in the Last Year: Never true  Transportation Needs: No Transportation Needs (07/30/2023)   PRAPARE - Administrator, Civil Service (Medical): No    Lack of Transportation (Non-Medical): No  Physical Activity: Sufficiently Active (07/30/2023)   Exercise Vital Sign    Days of Exercise per Week: 3 days    Minutes of Exercise per Session: 60 min  Stress: No Stress Concern Present  (07/30/2023)   [employer] of Occupational Health - Occupational Stress Questionnaire    Feeling of Stress : Only a little  Social Connections: Socially Integrated (07/30/2023)   Social Connection and Isolation Panel [NHANES]    Frequency of Communication with Friends and Family: More than three times a week    Frequency of Social Gatherings with Friends and Family: Once a week    Attends Religious Services: 1 to 4 times per year    Active Member of [employer] or Organizations: Yes    Attends Banker Meetings: 1 to 4 times per year    Marital Status: Married  Catering Manager Violence: Not on file    Family History  Problem Relation Age of Onset   Hyperlipidemia Mother    Heart failure Mother    Heart disease Father 21       stent    Cancer Brother 83       prostate   Heart failure Maternal Grandmother    Heart failure Maternal Grandfather    Diabetes Neg Hx    Stroke Neg Hx    Depression Neg Hx    Alcohol abuse Neg Hx    Colon cancer Neg Hx    Stomach cancer Neg Hx    Rectal cancer Neg Hx    Esophageal cancer Neg Hx    Colon polyps Neg Hx      Review of Systems Constitutional: negative for anorexia, fevers and sweats  Eyes: negative for irritation, redness and visual disturbance  Ears, nose, mouth, throat, and face: negative for earaches, epistaxis, nasal congestion and sore throat  Respiratory: negative for cough, dyspnea on exertion, sputum and wheezing  Cardiovascular: negative for chest pain, dyspnea, lower extremity edema, orthopnea, palpitations and syncope  Gastrointestinal: negative for abdominal pain, constipation, diarrhea, melena, nausea and vomiting  Genitourinary:negative for dysuria, frequency and hematuria  Hematologic/lymphatic: negative for bleeding, easy bruising and lymphadenopathy  Musculoskeletal:negative for arthralgias, muscle weakness and stiff joints  Neurological: negative for coordination problems, gait problems, headaches and  weakness  Endocrine: negative for diabetic symptoms including polydipsia, polyuria and weight loss     Objective:   Physical Exam  Gen. Pleasant, well-nourished, in no distress ENT - no thrush, no pallor/icterus,no post nasal drip, class 1 airway, deviated septum >> RT Neck: No JVD, no thyromegaly, no carotid bruits Lungs: no use of accessory muscles, no dullness to percussion, clear without rales or rhonchi  Cardiovascular: Rhythm regular, heart sounds  normal, no murmurs or gallops, no peripheral edema Musculoskeletal: No deformities, no cyanosis or clubbing        Assessment & Plan:     Assessment and Plan    Snoring Chronic snoring for several years, primarily noted by spouse. No significant daytime sleepiness, good energy levels, and no headaches or dry mouth. Previous unsuccessful attempts  to mitigate snoring with dental laser treatments and over-the-counter mouthpieces. Likely nasal origin due to deviated septum. Differential diagnosis includes primary snoring versus mild sleep apnea. Discussed potential use of CPAP, including nasal cannula type, and the importance of setting realistic expectations. CPAP can effectively reduce snoring by maintaining airway patency, but patient tolerance and insurance coverage are considerations. High motivation to find a solution. - Order home sleep study with nasal cannula to rule out sleep apnea - Discuss potential use of CPAP if sleep apnea is diagnosed - Review sleep study results and determine next steps  Hypertension Well-controlled with losartan . Family history of hypertension and heart conditions. Under cardiologist care. - Continue losartan  - Monitor blood pressure regularly  Hyperlipidemia Managed with Repatha  injections. Family history of heart conditions. - Continue Repatha  injections as prescribed   General Health Maintenance Exercises regularly and maintains a healthy diet. No significant weight issues. - Encourage  continued regular exercise and healthy diet  Follow-up - Follow up in 4 weeks to review sleep study results and discuss further management options.

## 2023-11-24 DIAGNOSIS — R0683 Snoring: Secondary | ICD-10-CM

## 2023-11-24 DIAGNOSIS — G4733 Obstructive sleep apnea (adult) (pediatric): Secondary | ICD-10-CM

## 2023-12-04 ENCOUNTER — Encounter (HOSPITAL_BASED_OUTPATIENT_CLINIC_OR_DEPARTMENT_OTHER): Payer: Self-pay | Admitting: Pulmonary Disease

## 2023-12-04 DIAGNOSIS — G4733 Obstructive sleep apnea (adult) (pediatric): Secondary | ICD-10-CM

## 2023-12-10 NOTE — Telephone Encounter (Signed)
HST showed severe OSA with AHI 60/ hr & low sat 78%  I was surprised that he had such severe degree of sleep apnea. He will definitely need CPAP therapy  Suggest CPAP titration study to see if we can control events with CPAP.  We can then plan to get him a machine

## 2023-12-10 NOTE — Telephone Encounter (Signed)
Left detailed message on pts VM. Let him know we did suggest CPAP titration. He is to call me back and let me know if he agrees or send a [employer].

## 2023-12-10 NOTE — Telephone Encounter (Signed)
Patient has agreed to do CPAP titration study. Please order.

## 2023-12-14 ENCOUNTER — Telehealth: Payer: Self-pay | Admitting: Pulmonary Disease

## 2023-12-14 DIAGNOSIS — G4733 Obstructive sleep apnea (adult) (pediatric): Secondary | ICD-10-CM

## 2023-12-14 NOTE — Telephone Encounter (Signed)
Pt's order for CPAP TITRATION has been denied by [facility]   Denial Reasoning:  Your doctor told us that you have a condition that causes short periods of not breathing while asleep (sleep apnea). Your doctor ordered a test to adjust your treatment. This test is needed if you have a reason why a different home treatment device cannot be used. These reasons might include lung or heart disease. We reviewed the notes we have. The notes do not show that you have one of these conditions. Based on the information we have, this test is not medically necessary. We used [employer] Management Clinical Guideline titled Sleep Disorder Management, Polysomnography and Home Sleep Apnea Testing to make this decision. You may view this guideline at www.[facility].com/mbm-guidelines-sleep.    Please let me know if youd like to complete P2P

## 2023-12-15 NOTE — Telephone Encounter (Signed)
Left pt to return call.

## 2023-12-22 NOTE — Addendum Note (Signed)
Addended by: Jama Flavors on: 12/22/2023 01:09 PM   Modules accepted: Orders

## 2023-12-22 NOTE — Telephone Encounter (Signed)
Patients insurance does not want to cover titration study. Can he start CPAP without it.

## 2023-12-22 NOTE — Telephone Encounter (Signed)
Order placed and patient is aware.

## 2023-12-22 NOTE — Telephone Encounter (Signed)
Order placed and patient notified via mychart

## 2023-12-28 ENCOUNTER — Other Ambulatory Visit: Payer: Self-pay | Admitting: Internal Medicine

## 2024-01-04 DIAGNOSIS — D225 Melanocytic nevi of trunk: Secondary | ICD-10-CM | POA: Diagnosis not present

## 2024-01-04 DIAGNOSIS — G4733 Obstructive sleep apnea (adult) (pediatric): Secondary | ICD-10-CM | POA: Diagnosis not present

## 2024-01-04 DIAGNOSIS — D2261 Melanocytic nevi of right upper limb, including shoulder: Secondary | ICD-10-CM | POA: Diagnosis not present

## 2024-01-04 DIAGNOSIS — L810 Postinflammatory hyperpigmentation: Secondary | ICD-10-CM | POA: Diagnosis not present

## 2024-01-04 DIAGNOSIS — D2262 Melanocytic nevi of left upper limb, including shoulder: Secondary | ICD-10-CM | POA: Diagnosis not present

## 2024-01-18 ENCOUNTER — Other Ambulatory Visit: Payer: Self-pay | Admitting: *Deleted

## 2024-01-18 DIAGNOSIS — E785 Hyperlipidemia, unspecified: Secondary | ICD-10-CM

## 2024-01-29 ENCOUNTER — Other Ambulatory Visit: Payer: Self-pay | Admitting: Internal Medicine

## 2024-02-01 DIAGNOSIS — G4733 Obstructive sleep apnea (adult) (pediatric): Secondary | ICD-10-CM | POA: Diagnosis not present

## 2024-03-02 ENCOUNTER — Other Ambulatory Visit: Payer: Self-pay | Admitting: Internal Medicine

## 2024-03-02 ENCOUNTER — Encounter: Payer: Self-pay | Admitting: Internal Medicine

## 2024-03-03 DIAGNOSIS — G4733 Obstructive sleep apnea (adult) (pediatric): Secondary | ICD-10-CM | POA: Diagnosis not present

## 2024-03-29 ENCOUNTER — Encounter: Payer: Self-pay | Admitting: Internal Medicine

## 2024-03-31 NOTE — Telephone Encounter (Signed)
 Pt has stated "Here we go again. I have some pretty intense pain on my right side, the same pain I have had before with my previous kidney stones. I am in [place] for work until Thursday. Is there any way you can proscribe a decent painkiller until I can get home and seen by a urologist? I hate to bother you but this pain is no joke. There is a Social worker near here where I stay in [place]. I very much appreciate your consideration, thanks! "

## 2024-04-04 ENCOUNTER — Other Ambulatory Visit: Payer: Self-pay | Admitting: Internal Medicine

## 2024-04-04 MED ORDER — HYDROCODONE-ACETAMINOPHEN 5-325 MG PO TABS: 1.0000 | ORAL_TABLET | ORAL | 0 refills | Status: DC | PRN

## 2024-04-04 MED ORDER — TAMSULOSIN HCL 0.4 MG PO CAPS: 0.4000 mg | ORAL_CAPSULE | Freq: Every day | ORAL | 3 refills | Status: AC

## 2024-04-04 MED ORDER — KETOROLAC TROMETHAMINE 10 MG PO TABS: 10.0000 mg | ORAL_TABLET | Freq: Three times a day (TID) | ORAL | 0 refills | Status: AC | PRN

## 2024-04-05 ENCOUNTER — Other Ambulatory Visit: Payer: Self-pay | Admitting: Internal Medicine

## 2024-05-09 ENCOUNTER — Other Ambulatory Visit: Payer: Self-pay | Admitting: Internal Medicine

## 2024-06-08 ENCOUNTER — Other Ambulatory Visit: Payer: Self-pay | Admitting: Internal Medicine

## 2024-06-09 ENCOUNTER — Telehealth: Payer: Self-pay

## 2024-06-09 NOTE — Telephone Encounter (Unsigned)
 Copied from CRM [id number]. Topic: Clinical - Prescription Issue >> Jun 09, 2024  4:18 PM Deleta RAMAN wrote: Reason for CRM: patient was told to schedule a visit for refill request. He is not understanding please give the patient a call at [phone]

## 2024-06-10 NOTE — Telephone Encounter (Signed)
 I was able to LVM for the pt informing him he does nt have to reschedule his appointment.

## 2024-06-13 ENCOUNTER — Other Ambulatory Visit: Payer: Self-pay | Admitting: Internal Medicine

## 2024-06-16 ENCOUNTER — Ambulatory Visit (INDEPENDENT_AMBULATORY_CARE_PROVIDER_SITE_OTHER): Admitting: Internal Medicine

## 2024-06-16 ENCOUNTER — Encounter: Payer: Self-pay | Admitting: Internal Medicine

## 2024-06-16 VITALS — BP 134/81 | HR 76 | Temp 97.7°F | Ht 73.0 in | Wt 181.0 lb

## 2024-06-16 DIAGNOSIS — Z860101 Personal history of adenomatous and serrated colon polyps: Secondary | ICD-10-CM

## 2024-06-16 DIAGNOSIS — Z0000 Encounter for general adult medical examination without abnormal findings: Secondary | ICD-10-CM | POA: Diagnosis not present

## 2024-06-16 DIAGNOSIS — I2583 Coronary atherosclerosis due to lipid rich plaque: Secondary | ICD-10-CM

## 2024-06-16 DIAGNOSIS — Z13220 Encounter for screening for lipoid disorders: Secondary | ICD-10-CM

## 2024-06-16 DIAGNOSIS — I10 Essential (primary) hypertension: Secondary | ICD-10-CM | POA: Diagnosis not present

## 2024-06-16 DIAGNOSIS — F4323 Adjustment disorder with mixed anxiety and depressed mood: Secondary | ICD-10-CM | POA: Diagnosis not present

## 2024-06-16 DIAGNOSIS — G4733 Obstructive sleep apnea (adult) (pediatric): Secondary | ICD-10-CM

## 2024-06-16 DIAGNOSIS — E785 Hyperlipidemia, unspecified: Secondary | ICD-10-CM

## 2024-06-16 DIAGNOSIS — G479 Sleep disorder, unspecified: Secondary | ICD-10-CM

## 2024-06-16 LAB — CBC WITH DIFFERENTIAL/PLATELET
Basophils Absolute: 0 K/uL (ref 0.0–0.1)
Basophils Relative: 0.4 % (ref 0.0–3.0)
Eosinophils Absolute: 0.2 K/uL (ref 0.0–0.7)
Eosinophils Relative: 2.7 % (ref 0.0–5.0)
HCT: 44.1 % (ref 39.0–52.0)
Hemoglobin: 14.8 g/dL (ref 13.0–17.0)
Lymphocytes Relative: 24.5 % (ref 12.0–46.0)
Lymphs Abs: 1.6 K/uL (ref 0.7–4.0)
MCHC: 33.6 g/dL (ref 30.0–36.0)
MCV: 91.3 fl (ref 78.0–100.0)
Monocytes Absolute: 0.7 K/uL (ref 0.1–1.0)
Monocytes Relative: 9.9 % (ref 3.0–12.0)
Neutro Abs: 4.1 K/uL (ref 1.4–7.7)
Neutrophils Relative %: 62.5 % (ref 43.0–77.0)
Platelets: 279 K/uL (ref 150.0–400.0)
RBC: 4.83 Mil/uL (ref 4.22–5.81)
RDW: 12.9 % (ref 11.5–15.5)
WBC: 6.6 K/uL (ref 4.0–10.5)

## 2024-06-16 LAB — COMPREHENSIVE METABOLIC PANEL WITH GFR
ALT: 28 U/L (ref 0–53)
AST: 20 U/L (ref 0–37)
Albumin: 4.4 g/dL (ref 3.5–5.2)
Alkaline Phosphatase: 65 U/L (ref 39–117)
BUN: 20 mg/dL (ref 6–23)
CO2: 26 meq/L (ref 19–32)
Calcium: 9.9 mg/dL (ref 8.4–10.5)
Chloride: 101 meq/L (ref 96–112)
Creatinine, Ser: 0.8 mg/dL (ref 0.40–1.50)
GFR: 97.21 mL/min (ref >60.00)
Glucose, Bld: 91 mg/dL (ref 70–99)
Potassium: 5.3 meq/L — ABNORMAL HIGH (ref 3.5–5.1)
Sodium: 140 meq/L (ref 135–145)
Total Bilirubin: 0.5 mg/dL (ref 0.2–1.2)
Total Protein: 7.4 g/dL (ref 6.0–8.3)

## 2024-06-16 LAB — URINALYSIS
Bilirubin Urine: NEGATIVE
Ketones, ur: NEGATIVE
Leukocytes,Ua: NEGATIVE
Nitrite: NEGATIVE
Specific Gravity, Urine: 1.025 (ref 1.000–1.030)
Total Protein, Urine: NEGATIVE
Urine Glucose: NEGATIVE
Urobilinogen, UA: 0.2 (ref 0.0–1.0)
pH: 6 (ref 5.0–8.0)

## 2024-06-16 LAB — PSA: PSA: 0.21 ng/mL (ref 0.10–4.00)

## 2024-06-16 LAB — TSH: TSH: 2.83 u[IU]/mL (ref 0.35–5.50)

## 2024-06-16 MED ORDER — LORAZEPAM 1 MG PO TABS: 1.0000 mg | ORAL_TABLET | Freq: Two times a day (BID) | ORAL | 5 refills | Status: DC | PRN

## 2024-06-16 NOTE — Assessment & Plan Note (Addendum)
 Lorazepam  prn at hs  Potential benefits of a long term benzodiazepines  use as well as potential risks  and complications were explained to the patient and were aknowledged.

## 2024-06-16 NOTE — Assessment & Plan Note (Signed)
 Dr Jude Unable to tolerate CPAP or other devices Reading about Inspire procedure

## 2024-06-16 NOTE — Assessment & Plan Note (Signed)
Re-start Losartan 

## 2024-06-16 NOTE — Assessment & Plan Note (Signed)
   Last colonoscopy 09/2023; next due in 2027-Dr. Avram

## 2024-06-16 NOTE — Assessment & Plan Note (Addendum)
 Unable to use CPAP, he tried everything else for OSA Lorazepam  prn at hs  Potential benefits of a long term benzodiazepines  use as well as potential risks  and complications were explained to the patient and were aknowledged.

## 2024-06-16 NOTE — Assessment & Plan Note (Signed)
 On Crestor  and Repatha  (2025) NMR ordered

## 2024-06-16 NOTE — Assessment & Plan Note (Signed)
 Coronary calcium  score of 25.  On Crestor  and Repatha 

## 2024-06-16 NOTE — Assessment & Plan Note (Signed)
  We discussed age appropriate health related issues, including available/recomended screening tests and vaccinations. Labs were ordered to be later reviewed . All questions were answered. We discussed one or more of the following - seat belt use, use of sunscreen/sun exposure exercise, fall risk reduction, second hand smoke exposure, firearm use and storage, seat belt use, a need for adhering to healthy diet and exercise. Labs were ordered.  All questions were answered. 2019 CT IMPRESSION: Coronary calcium  score of 25. This was 82 percentile for age and sex matched control.  Last colonoscopy 09/2023; next due in 2027-Dr. Avram Raven eye exam advised

## 2024-06-16 NOTE — Progress Notes (Signed)
 Please  Subjective:  Patient ID: [name] [name], male    DOB: Aug 20, 1966  Age: 58 y.o. MRN: [id number]  CC: Annual Exam   HPI [name] [name] presents for anxiety, dyslipidemia, colon polyp Well exam  Outpatient Medications Prior to Visit  Medication Sig Dispense Refill   Coenzyme Q10 (CO Q10) 100 MG CAPS Take 100 mg by mouth daily in the afternoon.     Evolocumab  (REPATHA  SURECLICK) 140 MG/ML SOAJ Inject 140 mg into the skin every 14 (fourteen) days. 6 mL 3   finasteride  (PROSCAR ) 5 MG tablet TAKE ONE TABLET BY MOUTH DAILY 90 tablet 1   ketorolac  (TORADOL ) 10 MG tablet Take 1 tablet (10 mg total) by mouth every 8 (eight) hours as needed. Renal colic 12 tablet 0   losartan  (COZAAR ) 100 MG tablet TAKE ONE TABLET BY MOUTH DAILY. Overdue for yearly appt must see MD for refills 90 tablet 3   rosuvastatin  (CRESTOR ) 20 MG tablet Take 1 tablet (20 mg total) by mouth daily. 90 tablet 3   tamsulosin  (FLOMAX ) 0.4 MG CAPS capsule Take 1 capsule (0.4 mg total) by mouth daily. 14 capsule 3   vitamin E  600 UNIT capsule Take 1 capsule (600 Units total) by mouth daily. 100 capsule 3   YOHIMBINE HCL PO Take 1 tablet by mouth daily before breakfast.     HYDROcodone -acetaminophen  (NORCO/VICODIN) 5-325 MG tablet Take 1 tablet by mouth every 4 (four) hours as needed for severe pain (pain score 7-10). 20 tablet 0   LORazepam  (ATIVAN ) 1 MG tablet TAKE ONE TABLET BY MOUTH TWICE DAILY AS NEEDED FOR ANXIETY 30 tablet 0   triamcinolone lotion (KENALOG) 0.1 %  (Patient not taking: Reported on 06/16/2024)     No facility-administered medications prior to visit.    ROS: Review of Systems  Constitutional:  Negative for appetite change, fatigue and unexpected weight change.  HENT:  Negative for congestion, nosebleeds, sneezing, sore throat and trouble swallowing.   Eyes:  Negative for itching and visual disturbance.  Respiratory:  Negative for cough.   Cardiovascular:  Negative for chest pain, palpitations and leg  swelling.  Gastrointestinal:  Negative for abdominal distention, blood in stool, diarrhea and nausea.  Genitourinary:  Negative for frequency and hematuria.  Musculoskeletal:  Negative for back pain, gait problem, joint swelling and neck pain.  Skin:  Negative for rash.  Neurological:  Negative for dizziness, tremors, speech difficulty and weakness.  Psychiatric/Behavioral:  Positive for sleep disturbance. Negative for agitation and dysphoric mood. The patient is not nervous/anxious.     Objective:  BP 134/81   Pulse 76   Temp 97.7 F (36.5 C) (Oral)   Ht 6' 1 (1.854 m)   Wt 181 lb (82.1 kg)   SpO2 98%   BMI 23.88 kg/m   BP Readings from Last 3 Encounters:  06/16/24 134/81  11/16/23 112/84  09/25/23 122/82    Wt Readings from Last 3 Encounters:  06/16/24 181 lb (82.1 kg)  11/16/23 179 lb 1.6 oz (81.2 kg)  09/25/23 180 lb (81.6 kg)    Physical Exam Constitutional:      General: He is not in acute distress.    Appearance: Normal appearance. He is well-developed.     Comments: NAD  Eyes:     Conjunctiva/sclera: Conjunctivae normal.     Pupils: Pupils are equal, round, and reactive to light.  Neck:     Thyroid : No thyromegaly.     Vascular: No JVD.  Cardiovascular:  Rate and Rhythm: Normal rate and regular rhythm.     Heart sounds: Normal heart sounds. No murmur heard.    No friction rub. No gallop.  Pulmonary:     Effort: Pulmonary effort is normal. No respiratory distress.     Breath sounds: Normal breath sounds. No wheezing or rales.  Chest:     Chest wall: No tenderness.  Abdominal:     General: Bowel sounds are normal. There is no distension.     Palpations: Abdomen is soft. There is no mass.     Tenderness: There is no abdominal tenderness. There is no guarding or rebound.  Musculoskeletal:        General: No tenderness. Normal range of motion.     Cervical back: Normal range of motion.  Lymphadenopathy:     Cervical: No cervical adenopathy.  Skin:     General: Skin is warm and dry.     Findings: No rash.  Neurological:     Mental Status: He is alert and oriented to person, place, and time.     Cranial Nerves: No cranial nerve deficit.     Motor: No abnormal muscle tone.     Coordination: Coordination normal.     Gait: Gait normal.     Deep Tendon Reflexes: Reflexes are normal and symmetric.  Psychiatric:        Behavior: Behavior normal.        Thought Content: Thought content normal.        Judgment: Judgment normal.   Prostate/rectal exam Per GI  Lab Results  Component Value Date   WBC 5.8 08/31/2023   HGB 14.6 08/31/2023   HCT 44.7 08/31/2023   PLT 299.0 08/31/2023   GLUCOSE 93 08/31/2023   CHOL 191 02/13/2023   TRIG 63.0 02/13/2023   HDL 71.30 02/13/2023   LDLDIRECT 133.4 03/14/2008   LDLCALC 107 (H) 02/13/2023   ALT 34 08/31/2023   AST 25 08/31/2023   NA 138 08/31/2023   K 3.9 08/31/2023   CL 102 08/31/2023   CREATININE 0.87 08/31/2023   BUN 20 08/31/2023   CO2 28 08/31/2023   TSH 1.69 08/31/2023   PSA 0.11 02/13/2023    No results found.  Assessment & Plan:   Problem List Items Addressed This Visit     Adjustment reaction with anxiety and depression   Lorazepam  prn at hs  Potential benefits of a long term benzodiazepines  use as well as potential risks  and complications were explained to the patient and were aknowledged.      Coronary atherosclerosis   On Crestor  and Repatha  (2025) NMR ordered      Dyslipidemia - Primary   Coronary calcium  score of 25.  On Crestor  and Repatha       Relevant Orders   TSH   NMR, lipoprofile   Essential hypertension   Re-start Losartan       Hx of adenomatous polyp of colon     Last colonoscopy 09/2023; next due in 2027-Dr. Gessner       OSA (obstructive sleep apnea)   Dr Jude Unable to tolerate CPAP or other devices Reading about Inspire procedure      Sleep disorder   Unable to use CPAP, he tried everything else for OSA Lorazepam  prn at hs   Potential benefits of a long term benzodiazepines  use as well as potential risks  and complications were explained to the patient and were aknowledged.      Well adult exam  We discussed age appropriate health related issues, including available/recomended screening tests and vaccinations. Labs were ordered to be later reviewed . All questions were answered. We discussed one or more of the following - seat belt use, use of sunscreen/sun exposure exercise, fall risk reduction, second hand smoke exposure, firearm use and storage, seat belt use, a need for adhering to healthy diet and exercise. Labs were ordered.  All questions were answered. 2019 CT IMPRESSION: Coronary calcium  score of 25. This was 66 percentile for age and sex matched control.  Last colonoscopy 09/2023; next due in 2027-Dr. Avram Yearly eye exam advised      Relevant Orders   TSH   Urinalysis   CBC with Differential/Platelet   PSA   Comprehensive metabolic panel with GFR   NMR, lipoprofile      Meds ordered this encounter  Medications   LORazepam  (ATIVAN ) 1 MG tablet    Sig: Take 1 tablet (1 mg total) by mouth 2 (two) times daily as needed. for anxiety    Dispense:  30 tablet    Refill:  5      Follow-up: Return in about 6 months (around 12/17/2024) for a follow-up visit.  Marolyn Noel, MD

## 2024-06-17 ENCOUNTER — Ambulatory Visit: Payer: Self-pay | Admitting: Internal Medicine

## 2024-06-17 LAB — NMR, LIPOPROFILE
Cholesterol, Total: 163 mg/dL (ref 100–199)
HDL Particle Number: 45.2 umol/L (ref >=30.5)
HDL-C: 74 mg/dL (ref >39)
LDL Particle Number: 730 nmol/L (ref <1000)
LDL Size: 20.9 nm (ref >20.5)
LDL-C (NIH Calc): 74 mg/dL (ref 0–99)
LP-IR Score: 39 (ref <=45)
Small LDL Particle Number: 388 nmol/L (ref <=527)
Triglycerides: 78 mg/dL (ref 0–149)

## 2024-06-20 ENCOUNTER — Encounter (HOSPITAL_BASED_OUTPATIENT_CLINIC_OR_DEPARTMENT_OTHER): Payer: Self-pay | Admitting: Internal Medicine

## 2024-07-25 ENCOUNTER — Other Ambulatory Visit: Payer: Self-pay | Admitting: Internal Medicine

## 2024-07-25 DIAGNOSIS — E785 Hyperlipidemia, unspecified: Secondary | ICD-10-CM

## 2024-07-25 DIAGNOSIS — R931 Abnormal findings on diagnostic imaging of heart and coronary circulation: Secondary | ICD-10-CM

## 2024-07-25 DIAGNOSIS — I700 Atherosclerosis of aorta: Secondary | ICD-10-CM

## 2024-08-04 ENCOUNTER — Other Ambulatory Visit: Payer: Self-pay | Admitting: Nurse Practitioner

## 2024-08-10 ENCOUNTER — Telehealth: Payer: Self-pay

## 2024-08-10 ENCOUNTER — Other Ambulatory Visit (HOSPITAL_COMMUNITY): Payer: Self-pay

## 2024-08-10 NOTE — Telephone Encounter (Signed)
 Pharmacy Patient Advocate Encounter   Received notification from Physician's Office that prior authorization for REPATHA  is required/requested.   Insurance verification completed.   The patient is insured through [facility].   Per test claim: PA required; PA submitted to above mentioned insurance via Latent Key/confirmation #/EOC [facility] Status is pending

## 2024-08-15 ENCOUNTER — Other Ambulatory Visit (HOSPITAL_COMMUNITY): Payer: Self-pay

## 2024-08-16 ENCOUNTER — Other Ambulatory Visit (HOSPITAL_COMMUNITY): Payer: Self-pay

## 2024-08-16 NOTE — Telephone Encounter (Signed)
 Pharmacy Patient Advocate Encounter  Received notification from [facility] that Prior Authorization for REPATHA  has been APPROVED from 08/10/24 to 08/09/25. Ran test claim, Copay is $25. This test claim was processed through [facility]- copay amounts may vary at other pharmacies due to pharmacy/plan contracts, or as the patient moves through the different stages of their insurance plan.

## 2024-08-30 ENCOUNTER — Other Ambulatory Visit: Payer: Self-pay | Admitting: Internal Medicine

## 2024-10-03 ENCOUNTER — Other Ambulatory Visit: Payer: Self-pay | Admitting: Internal Medicine

## 2024-11-07 ENCOUNTER — Other Ambulatory Visit: Payer: Self-pay | Admitting: Nurse Practitioner

## 2024-12-12 ENCOUNTER — Other Ambulatory Visit: Payer: Self-pay

## 2024-12-12 MED ORDER — ROSUVASTATIN CALCIUM 20 MG PO TABS: 20.0000 mg | ORAL_TABLET | Freq: Every day | ORAL | 0 refills | Status: AC

## 2024-12-12 NOTE — Telephone Encounter (Signed)
 Scheduled 04/17/25.

## 2024-12-12 NOTE — Telephone Encounter (Signed)
 15 day supply sent, pt overdue to see Dr. Mona.

## 2024-12-15 NOTE — Addendum Note (Signed)
 Addended by: DAVEE IZETTA CROME on: 12/15/2024 09:33 AM   Modules accepted: Orders

## 2025-04-17 ENCOUNTER — Ambulatory Visit: Admitting: Cardiovascular Disease
